# Patient Record
Sex: Male | Born: 1935 | Race: White | Hispanic: No | Marital: Single | Smoking: Never smoker
Health system: Southern US, Community
[De-identification: ages and names within clinical notes are randomized; demographics above are authoritative.]

## PROBLEM LIST (undated history)

## (undated) DIAGNOSIS — C61 Malignant neoplasm of prostate: Secondary | ICD-10-CM

## (undated) DIAGNOSIS — Q899 Congenital malformation, unspecified: Secondary | ICD-10-CM

## (undated) DIAGNOSIS — N200 Calculus of kidney: Secondary | ICD-10-CM

## (undated) HISTORY — PX: CATARACT EXTRACTION W/ INTRAOCULAR LENS IMPLANT: SHX1309

---

## 2011-02-06 ENCOUNTER — Emergency Department (HOSPITAL_COMMUNITY): Payer: Medicare Other

## 2011-02-06 ENCOUNTER — Emergency Department (HOSPITAL_COMMUNITY)
Admission: EM | Admit: 2011-02-06 | Discharge: 2011-02-06 | Discharge status: Against Medical Advice | Payer: Medicare Other | Attending: Emergency Medicine | Admitting: Emergency Medicine

## 2011-02-06 DIAGNOSIS — Y9269 Other specified industrial and construction area as the place of occurrence of the external cause: Secondary | ICD-10-CM | POA: Insufficient documentation

## 2011-02-06 DIAGNOSIS — S0003XA Contusion of scalp, initial encounter: Secondary | ICD-10-CM | POA: Insufficient documentation

## 2011-02-06 DIAGNOSIS — W1809XA Striking against other object with subsequent fall, initial encounter: Secondary | ICD-10-CM | POA: Insufficient documentation

## 2011-02-06 DIAGNOSIS — S0120XA Unspecified open wound of nose, initial encounter: Secondary | ICD-10-CM | POA: Insufficient documentation

## 2012-11-18 ENCOUNTER — Encounter (HOSPITAL_COMMUNITY): Payer: Self-pay | Admitting: *Deleted

## 2012-11-18 ENCOUNTER — Emergency Department (HOSPITAL_COMMUNITY)
Admission: EM | Admit: 2012-11-18 | Discharge: 2012-11-18 | Disposition: A | Discharge status: Home / Self Care | Payer: Medicare Other | Attending: Emergency Medicine | Admitting: Emergency Medicine

## 2012-11-18 ENCOUNTER — Emergency Department (HOSPITAL_COMMUNITY): Payer: Medicare Other

## 2012-11-18 ENCOUNTER — Other Ambulatory Visit: Payer: Self-pay

## 2012-11-18 DIAGNOSIS — E875 Hyperkalemia: Secondary | ICD-10-CM

## 2012-11-18 DIAGNOSIS — R197 Diarrhea, unspecified: Secondary | ICD-10-CM | POA: Insufficient documentation

## 2012-11-18 DIAGNOSIS — R112 Nausea with vomiting, unspecified: Secondary | ICD-10-CM | POA: Insufficient documentation

## 2012-11-18 DIAGNOSIS — N201 Calculus of ureter: Secondary | ICD-10-CM | POA: Insufficient documentation

## 2012-11-18 LAB — CBC WITH DIFFERENTIAL/PLATELET
Basophils Absolute: 0 10*3/uL (ref 0.0–0.1)
Basophils Relative: 0 % (ref 0–1)
Eosinophils Absolute: 0 10*3/uL (ref 0.0–0.7)
Eosinophils Relative: 0 % (ref 0–5)
HCT: 48.5 % (ref 39.0–52.0)
Hemoglobin: 16.7 g/dL (ref 13.0–17.0)
MCH: 30.8 pg (ref 26.0–34.0)
MCHC: 34.4 g/dL (ref 30.0–36.0)
Monocytes Absolute: 0.2 10*3/uL (ref 0.1–1.0)
Monocytes Relative: 2 % — ABNORMAL LOW (ref 3–12)
Neutro Abs: 9.2 10*3/uL — ABNORMAL HIGH (ref 1.7–7.7)
RDW: 13.2 % (ref 11.5–15.5)

## 2012-11-18 LAB — URINALYSIS, ROUTINE W REFLEX MICROSCOPIC
Bilirubin Urine: NEGATIVE
Glucose, UA: NEGATIVE mg/dL
Leukocytes, UA: NEGATIVE
pH: 6.5 (ref 5.0–8.0)

## 2012-11-18 LAB — URINE MICROSCOPIC-ADD ON

## 2012-11-18 LAB — COMPREHENSIVE METABOLIC PANEL
AST: 21 U/L (ref 0–37)
Albumin: 3.9 g/dL (ref 3.5–5.2)
BUN: 19 mg/dL (ref 6–23)
Calcium: 9.7 mg/dL (ref 8.4–10.5)
Creatinine, Ser: 1.22 mg/dL (ref 0.50–1.35)
Total Bilirubin: 1.1 mg/dL (ref 0.3–1.2)
Total Protein: 7.6 g/dL (ref 6.0–8.3)

## 2012-11-18 MED ORDER — SODIUM CHLORIDE 0.9 % IV SOLN
INTRAVENOUS | Status: DC
  Administered 2012-11-18: 14:00:00 via INTRAVENOUS

## 2012-11-18 MED ORDER — TAMSULOSIN HCL 0.4 MG PO CAPS: ORAL_CAPSULE | ORAL | Status: DC

## 2012-11-18 MED ORDER — HYDROMORPHONE HCL PF 1 MG/ML IJ SOLN
INTRAMUSCULAR | Status: AC
  Filled 2012-11-18: qty 1

## 2012-11-18 MED ORDER — SODIUM POLYSTYRENE SULFONATE 15 GM/60ML PO SUSP
30.0000 g | Freq: Once | ORAL | Status: AC
  Administered 2012-11-18: 30 g via ORAL
  Filled 2012-11-18: qty 120

## 2012-11-18 MED ORDER — MORPHINE SULFATE 4 MG/ML IJ SOLN
4.0000 mg | Freq: Once | INTRAMUSCULAR | Status: AC
  Administered 2012-11-18: 4 mg via INTRAVENOUS
  Filled 2012-11-18: qty 1

## 2012-11-18 MED ORDER — HYDROMORPHONE HCL PF 1 MG/ML IJ SOLN
1.0000 mg | Freq: Once | INTRAMUSCULAR | Status: AC
  Administered 2012-11-18: 1 mg via INTRAVENOUS
  Filled 2012-11-18: qty 1

## 2012-11-18 MED ORDER — HYDROMORPHONE HCL PF 1 MG/ML IJ SOLN
1.0000 mg | Freq: Once | INTRAMUSCULAR | Status: AC
  Administered 2012-11-18: 1 mg via INTRAVENOUS

## 2012-11-18 MED ORDER — ONDANSETRON 8 MG PO TBDP: 8.0000 mg | ORAL_TABLET | Freq: Three times a day (TID) | ORAL | Status: DC | PRN

## 2012-11-18 MED ORDER — SODIUM CHLORIDE 0.9 % IV BOLUS (SEPSIS)
700.0000 mL | Freq: Once | INTRAVENOUS | Status: AC
  Administered 2012-11-18: 20:00:00 via INTRAVENOUS

## 2012-11-18 MED ORDER — ONDANSETRON HCL 4 MG/2ML IJ SOLN
4.0000 mg | Freq: Once | INTRAMUSCULAR | Status: AC
  Administered 2012-11-18: 4 mg via INTRAVENOUS
  Filled 2012-11-18: qty 2

## 2012-11-18 MED ORDER — OXYCODONE-ACETAMINOPHEN 5-325 MG PO TABS: ORAL_TABLET | ORAL | Status: DC

## 2012-11-18 NOTE — ED Provider Notes (Addendum)
History   This chart was scribed for Ward Givens, MD by Charolett Bumpers, ED Scribe. The patient was seen in room APA06/APA06. Patient's care was started at 1327.   CSN: [id number]  Arrival date & time 11/18/12  1246   First MD Initiated Contact with Patient 11/18/12 1327      Chief Complaint  Patient presents with  . Abdominal Pain   Tory Chesnut is a 77 y.o. male who presents to the Emergency Department complaining of constant, sudden onset lower left flank pain that started around 8 am this morning. He has had x3 episodes of nausea, vomiting and non-bloody diarrhea. He reports minimal left-sided abdominal pain. He denies any modifying factors. He denies any melena, hematemesis, fever, dizziness, light-headedness, hematuria or dark urine. He denies any h/o similar symptoms. He denies any recent antibiotic use. He ambulates independently. He denies any h/o an aneurysm, kidney stones.    Patient is a 77 y.o. male presenting with flank pain. The history is provided by the patient. No language interpreter was used.  Flank Pain This is a new problem. The current episode started 3 to 5 hours ago. The problem occurs constantly. The problem has been gradually worsening. Associated symptoms include abdominal pain. Nothing aggravates the symptoms. Nothing relieves the symptoms.   PCP: Dr. Ouida Sills  History reviewed. No pertinent past medical history.  History reviewed. No pertinent past surgical history.  History reviewed. No pertinent family history.  History  Substance Use Topics  . Smoking status: Never Smoker   . Smokeless tobacco: Not on file  . Alcohol Use: No   He lives at home with his sister.    Review of Systems  Constitutional: Negative for fever.  Gastrointestinal: Positive for nausea, vomiting, abdominal pain and diarrhea.  Genitourinary: Positive for flank pain. Negative for hematuria.  Neurological: Negative for dizziness and weakness.  All other systems reviewed and  are negative.    Allergies  Review of patient's allergies indicates no known allergies.  Home Medications  No current outpatient prescriptions on file.  BP 135/75  Pulse 68  Temp 97.4 F (36.3 C) (Oral)  Resp 20  Ht 5\' 9"  (1.753 m)  Wt 195 lb (88.451 kg)  BMI 28.80 kg/m2  SpO2 100%  Vital signs normal    Physical Exam  Nursing note and vitals reviewed. Constitutional: He is oriented to person, place, and time. He appears well-developed and well-nourished. No distress.  HENT:  Head: Normocephalic and atraumatic.  Right Ear: External ear normal.  Left Ear: External ear normal.  Nose: Nose normal.  Mouth/Throat: Oropharynx is clear and moist.  Eyes: Conjunctivae normal and EOM are normal. Pupils are equal, round, and reactive to light.  Neck: Normal range of motion. Neck supple. No tracheal deviation present.  Cardiovascular: Normal rate, regular rhythm and normal heart sounds.   No murmur heard. Pulmonary/Chest: Effort normal and breath sounds normal. No respiratory distress. He has no wheezes. He has no rales.  Abdominal: Soft. Bowel sounds are normal. He exhibits no distension and no mass. There is no tenderness. There is no rebound and no guarding.       No pulsatile masses. Unable to localize pain by palpation  Musculoskeletal: Normal range of motion. He exhibits tenderness. He exhibits no edema.       Lower left flank is non-tender but is where he states he has pain  Neurological: He is alert and oriented to person, place, and time.  Skin: Skin is warm and dry.  Psychiatric: His behavior is normal.       Flat affect    ED Course  Procedures (including critical care time)   Medications  0.9 %  sodium chloride infusion (  Intravenous New Bag/Given 11/18/12 1420)  latanoprost (XALATAN) 0.005 % ophthalmic solution (not administered)  oxyCODONE-acetaminophen (PERCOCET/ROXICET) 5-325 MG per tablet (not administered)  ondansetron (ZOFRAN ODT) 8 MG disintegrating  tablet (not administered)  Tamsulosin HCl (FLOMAX) 0.4 MG CAPS (not administered)  morphine 4 MG/ML injection 4 mg (4 mg Intravenous Given 11/18/12 1418)  ondansetron (ZOFRAN) injection 4 mg (4 mg Intravenous Given 11/18/12 1418)  HYDROmorphone (DILAUDID) injection 1 mg (1 mg Intravenous Given 11/18/12 1437)  morphine 4 MG/ML injection 4 mg (4 mg Intravenous Given 11/18/12 1644)  sodium polystyrene (KAYEXALATE) 15 GM/60ML suspension 30 g (30 g Oral Given 11/18/12 2014)  sodium chloride 0.9 % bolus 700 mL (0 mL Intravenous Stopped 11/18/12 2136)     DIAGNOSTIC STUDIES: Oxygen Saturation is 100% on room air, normal by my interpretation.    COORDINATION OF CARE:  13:45-Discussed planned course of treatment with the patient including, pain medication, CT of abdomen, UA and blood work, who is agreeable at this time.   Lab reports blood not hemolyzed.  Pt given kayexalate for his hyperkalemia.  Bladder scan done after patient stated he couldn't urinate. It showed 192 cc of urine. Pt given IV bolus.   Pt finally gave urine sample at 21:39 which was very cloudy and dark brown in color.   Results for orders placed during the hospital encounter of 11/18/12  URINALYSIS, ROUTINE W REFLEX MICROSCOPIC      Component Value Range   Color, Urine YELLOW  YELLOW   APPearance HAZY (*) CLEAR   Specific Gravity, Urine 1.020  1.005 - 1.030   pH 6.5  5.0 - 8.0   Glucose, UA NEGATIVE  NEGATIVE mg/dL   Hgb urine dipstick LARGE (*) NEGATIVE   Bilirubin Urine NEGATIVE  NEGATIVE   Ketones, ur TRACE (*) NEGATIVE mg/dL   Protein, ur TRACE (*) NEGATIVE mg/dL   Urobilinogen, UA 0.2  0.0 - 1.0 mg/dL   Nitrite NEGATIVE  NEGATIVE   Leukocytes, UA NEGATIVE  NEGATIVE  CBC WITH DIFFERENTIAL      Component Value Range   WBC 10.1  4.0 - 10.5 K/uL   RBC 5.43  4.22 - 5.81 MIL/uL   Hemoglobin 16.7  13.0 - 17.0 g/dL   HCT 16.1  09.6 - 04.5 %   MCV 89.3  78.0 - 100.0 fL   MCH 30.8  26.0 - 34.0 pg   MCHC 34.4  30.0 -  36.0 g/dL   RDW 40.9  81.1 - 91.4 %   Platelets 194  150 - 400 K/uL   Neutrophils Relative 91 (*) 43 - 77 %   Neutro Abs 9.2 (*) 1.7 - 7.7 K/uL   Lymphocytes Relative 7 (*) 12 - 46 %   Lymphs Abs 0.7  0.7 - 4.0 K/uL   Monocytes Relative 2 (*) 3 - 12 %   Monocytes Absolute 0.2  0.1 - 1.0 K/uL   Eosinophils Relative 0  0 - 5 %   Eosinophils Absolute 0.0  0.0 - 0.7 K/uL   Basophils Relative 0  0 - 1 %   Basophils Absolute 0.0  0.0 - 0.1 K/uL  COMPREHENSIVE METABOLIC PANEL      Component Value Range   Sodium 137  135 - 145 mEq/L   Potassium 5.9 (*) 3.5 - 5.1  mEq/L   Chloride 101  96 - 112 mEq/L   CO2 25  19 - 32 mEq/L   Glucose, Bld 137 (*) 70 - 99 mg/dL   BUN 19  6 - 23 mg/dL   Creatinine, Ser 8.11  0.50 - 1.35 mg/dL   Calcium 9.7  8.4 - 91.4 mg/dL   Total Protein 7.6  6.0 - 8.3 g/dL   Albumin 3.9  3.5 - 5.2 g/dL   AST 21  0 - 37 U/L   ALT 23  0 - 53 U/L   Alkaline Phosphatase 75  39 - 117 U/L   Total Bilirubin 1.1  0.3 - 1.2 mg/dL   GFR calc non Af Amer 56 (*) >90 mL/min   GFR calc Af Amer 65 (*) >90 mL/min  URINE MICROSCOPIC-ADD ON      Component Value Range   Squamous Epithelial / LPF RARE  RARE   WBC, UA 0-2  <3 WBC/hpf   RBC / HPF TOO NUMEROUS TO COUNT  <3 RBC/hpf   Bacteria, UA FEW (*) RARE    UA pending, Dr Preston Fleeting to check  Laboratory interpretation all normal except hyperkalemia, hematuria without WBC's    Ct Abdomen Pelvis Wo Contrast  11/18/2012  *RADIOLOGY REPORT*  Clinical Data: Abdominal pain and vomiting.  Diarrhea.  Left flank pain.  CT ABDOMEN AND PELVIS WITHOUT CONTRAST  Technique:  Multidetector CT imaging of the abdomen and pelvis was performed following the standard protocol without intravenous contrast.  Comparison: None.  Findings: Mild interstitial coarsening is present bilaterally. There is some bronchiectasis at the right lung base.  No focal airspace consolidation is present.  No significant nodule or mass lesion is present.  The heart size is  normal.  No significant pleural or pericardial effusion is present.  The liver and spleen are within normal limits.  There is wall thickening and dilation of the distal esophagus.  The duodenal diverticulum is present in the second portion of the duodenum.  The pancreas is unremarkable.  The common bile duct and gallbladder are normal.  The adrenal glands are normal bilaterally.  The right kidney and ureter are unremarkable.  Mild left-sided hydronephrosis and perirenal stranding is evident. A 2 mm distal left ureteral jet stone is present just above the UVJ.  The urinary bladder is within normal limits.  A loop of the superior sigmoid colon and associated fat is herniated into the left inguinal canal.  This is not obstructing.  The more distal rectosigmoid colon is within normal limits.  The remainder of the colon is mostly collapsed.  The appendix is visualized and normal. The small bowel is unremarkable.  An umbilical hernia measures 2.2 cm in width.  There is no associated bowel.  The bone windows demonstrate mild leftward curvature of the lumbar spine, centered at L3.  Endplate degenerative changes are present in the lower lumbar spine.  No focal lytic or blastic lesions are evident.  IMPRESSION:  1.  Obstructing 2 mm distal left ureteral stone with mild left- sided hydronephrosis and ureteral dilation. 2.  A loop of sigmoid colon and fat herniating into the left inguinal canal without obstruction. 3.  The atherosclerosis. 4.  Dilation of the distal esophagus and wall thickening.  This may related to a motility disorder.  Neoplasm is considered less likely.  Consider endoscopy if not recently performed. 5.  Chronic interstitial lung changes as described.   Original Report Authenticated By: Marin Roberts, M.D.     Date: 11/18/2012  Rate: 82  Rhythm: normal sinus rhythm  QRS Axis: left  Intervals: normal  ST/T Wave abnormalities: normal  Conduction Disutrbances:none  Narrative Interpretation:    Old EKG Reviewed: none available     1. Ureteral stone   2. Hyperkalemia     New Prescriptions   ONDANSETRON (ZOFRAN ODT) 8 MG DISINTEGRATING TABLET    Take 1 tablet (8 mg total) by mouth every 8 (eight) hours as needed for nausea.   OXYCODONE-ACETAMINOPHEN (PERCOCET/ROXICET) 5-325 MG PER TABLET    Take 1 or 2 po Q 6hrs for pain   TAMSULOSIN HCL (FLOMAX) 0.4 MG CAPS    Once daily until you pass the kidney stone    Plan discharge  Devoria Albe, MD, FACEP   MDM  I personally performed the services described in this documentation, which was scribed in my presence. The recorded information has been reviewed and considered.  Devoria Albe, MD, FACEP      Ward Givens, MD 11/18/12 1610  Ward Givens, MD 11/18/12 2222

## 2012-11-18 NOTE — ED Notes (Signed)
Family at bedside. 

## 2012-11-18 NOTE — ED Notes (Signed)
Pt has finished kayexalate.

## 2012-11-18 NOTE — ED Notes (Signed)
Patient states he is unable to give a urine sample at this time

## 2012-11-18 NOTE — ED Notes (Signed)
Assisted patient with a urinal. Patient was not able to void. Patient states he does not want a in and out done. RN made aware.

## 2012-11-18 NOTE — ED Notes (Addendum)
Used bladder scanner on pt. Greater than 192 ml of urine in bladder. EDP will be notified and bolus started. Pt has lump on the left side of his groin area. EDP made aware

## 2012-11-18 NOTE — ED Notes (Signed)
abd and back pain for 3 hours,  Vomiting, diarrhea.

## 2014-07-14 DIAGNOSIS — C61 Malignant neoplasm of prostate: Secondary | ICD-10-CM

## 2014-07-14 HISTORY — DX: Malignant neoplasm of prostate: C61

## 2014-07-23 ENCOUNTER — Emergency Department (HOSPITAL_COMMUNITY): Payer: Medicare Other

## 2014-07-23 ENCOUNTER — Emergency Department (HOSPITAL_COMMUNITY)
Admission: EM | Admit: 2014-07-23 | Discharge: 2014-07-23 | Disposition: A | Discharge status: Home / Self Care | Payer: Medicare Other | Attending: Emergency Medicine | Admitting: Emergency Medicine

## 2014-07-23 ENCOUNTER — Encounter (HOSPITAL_COMMUNITY): Payer: Self-pay | Admitting: Emergency Medicine

## 2014-07-23 DIAGNOSIS — K4030 Unilateral inguinal hernia, with obstruction, without gangrene, not specified as recurrent: Secondary | ICD-10-CM | POA: Diagnosis not present

## 2014-07-23 DIAGNOSIS — Z87442 Personal history of urinary calculi: Secondary | ICD-10-CM | POA: Insufficient documentation

## 2014-07-23 DIAGNOSIS — R109 Unspecified abdominal pain: Secondary | ICD-10-CM | POA: Diagnosis present

## 2014-07-23 DIAGNOSIS — Z79899 Other long term (current) drug therapy: Secondary | ICD-10-CM | POA: Diagnosis not present

## 2014-07-23 DIAGNOSIS — K5900 Constipation, unspecified: Secondary | ICD-10-CM | POA: Diagnosis not present

## 2014-07-23 DIAGNOSIS — R591 Generalized enlarged lymph nodes: Secondary | ICD-10-CM

## 2014-07-23 DIAGNOSIS — R599 Enlarged lymph nodes, unspecified: Secondary | ICD-10-CM | POA: Insufficient documentation

## 2014-07-23 DIAGNOSIS — R110 Nausea: Secondary | ICD-10-CM | POA: Insufficient documentation

## 2014-07-23 HISTORY — DX: Calculus of kidney: N20.0

## 2014-07-23 LAB — CBC WITH DIFFERENTIAL/PLATELET
Basophils Absolute: 0 10*3/uL (ref 0.0–0.1)
Basophils Relative: 0 % (ref 0–1)
EOS ABS: 0 10*3/uL (ref 0.0–0.7)
Eosinophils Relative: 0 % (ref 0–5)
HCT: 46.9 % (ref 39.0–52.0)
HEMOGLOBIN: 16.2 g/dL (ref 13.0–17.0)
LYMPHS ABS: 0.6 10*3/uL — AB (ref 0.7–4.0)
LYMPHS PCT: 2 % — AB (ref 12–46)
MCH: 30.5 pg (ref 26.0–34.0)
MCHC: 34.5 g/dL (ref 30.0–36.0)
MCV: 88.2 fL (ref 78.0–100.0)
MONOS PCT: 5 % (ref 3–12)
Monocytes Absolute: 1 10*3/uL (ref 0.1–1.0)
NEUTROS PCT: 93 % — AB (ref 43–77)
Neutro Abs: 20.9 10*3/uL — ABNORMAL HIGH (ref 1.7–7.7)
Platelets: 231 10*3/uL (ref 150–400)
RBC: 5.32 MIL/uL (ref 4.22–5.81)
RDW: 13.4 % (ref 11.5–15.5)
WBC: 22.5 10*3/uL — AB (ref 4.0–10.5)

## 2014-07-23 LAB — URINALYSIS, ROUTINE W REFLEX MICROSCOPIC
Glucose, UA: NEGATIVE mg/dL
Ketones, ur: NEGATIVE mg/dL
Leukocytes, UA: NEGATIVE
NITRITE: NEGATIVE
PH: 5 (ref 5.0–8.0)
Protein, ur: NEGATIVE mg/dL
Urobilinogen, UA: 2 mg/dL — ABNORMAL HIGH (ref 0.0–1.0)

## 2014-07-23 LAB — COMPREHENSIVE METABOLIC PANEL
ALK PHOS: 95 U/L (ref 39–117)
ALT: 23 U/L (ref 0–53)
ANION GAP: 14 (ref 5–15)
AST: 29 U/L (ref 0–37)
Albumin: 3.6 g/dL (ref 3.5–5.2)
BILIRUBIN TOTAL: 1.6 mg/dL — AB (ref 0.3–1.2)
BUN: 26 mg/dL — AB (ref 6–23)
CO2: 24 meq/L (ref 19–32)
Calcium: 9.3 mg/dL (ref 8.4–10.5)
Chloride: 102 mEq/L (ref 96–112)
Creatinine, Ser: 1.09 mg/dL (ref 0.50–1.35)
GFR, EST AFRICAN AMERICAN: 74 mL/min — AB (ref >90)
GFR, EST NON AFRICAN AMERICAN: 63 mL/min — AB (ref >90)
GLUCOSE: 191 mg/dL — AB (ref 70–99)
POTASSIUM: 5.1 meq/L (ref 3.7–5.3)
Sodium: 140 mEq/L (ref 137–147)
TOTAL PROTEIN: 8 g/dL (ref 6.0–8.3)

## 2014-07-23 LAB — URINE MICROSCOPIC-ADD ON

## 2014-07-23 MED ORDER — ONDANSETRON HCL 4 MG/2ML IJ SOLN
4.0000 mg | Freq: Once | INTRAMUSCULAR | Status: AC
  Administered 2014-07-23: 4 mg via INTRAVENOUS
  Filled 2014-07-23: qty 2

## 2014-07-23 MED ORDER — SODIUM CHLORIDE 0.9 % IJ SOLN
INTRAMUSCULAR | Status: AC
  Filled 2014-07-23: qty 30

## 2014-07-23 MED ORDER — IOHEXOL 300 MG/ML  SOLN
100.0000 mL | Freq: Once | INTRAMUSCULAR | Status: AC | PRN
  Administered 2014-07-23: 100 mL via INTRAVENOUS

## 2014-07-23 MED ORDER — HYDROCODONE-ACETAMINOPHEN 5-325 MG PO TABS: ORAL_TABLET | ORAL | Status: DC

## 2014-07-23 MED ORDER — MORPHINE SULFATE 4 MG/ML IJ SOLN
4.0000 mg | Freq: Once | INTRAMUSCULAR | Status: AC
  Administered 2014-07-23: 4 mg via INTRAVENOUS
  Filled 2014-07-23: qty 1

## 2014-07-23 MED ORDER — MORPHINE SULFATE 2 MG/ML IJ SOLN
2.0000 mg | Freq: Once | INTRAMUSCULAR | Status: AC
  Administered 2014-07-23: 2 mg via INTRAVENOUS
  Filled 2014-07-23: qty 1

## 2014-07-23 NOTE — Consult Note (Signed)
Reason for Consult: Incarcerated left anal hernia Referring Physician: ER  Bill Padilla is an 79 y.o. male.  HPI: Patient is a 79 year old white male who presented emergency room with left-sided abdominal pain. He did have an episode of emesis yesterday evening. He thought he had a kidney stone which she states she is having the past. On examination, he was noted to have a large left inguinal hernia which could not be reduced. He states he has had hernias present for years. Currently he does not have pain in the left groin region  Past Medical History  Diagnosis Date  . Kidney stones     History reviewed. No pertinent past surgical history.  No family history on file.  Social History:  reports that he has never smoked. He does not have any smokeless tobacco history on file. He reports that he does not drink alcohol or use illicit drugs.  Allergies: No Known Allergies  Medications: I have reviewed the patient's current medications.  Results for orders placed during the hospital encounter of 07/23/14 (from the past 48 hour(s))  CBC WITH DIFFERENTIAL     Status: Abnormal   Collection Time    07/23/14  9:02 AM      Result Value Ref Range   WBC 22.5 (*) 4.0 - 10.5 K/uL   RBC 5.32  4.22 - 5.81 MIL/uL   Hemoglobin 16.2  13.0 - 17.0 g/dL   HCT 46.9  39.0 - 52.0 %   MCV 88.2  78.0 - 100.0 fL   MCH 30.5  26.0 - 34.0 pg   MCHC 34.5  30.0 - 36.0 g/dL   RDW 13.4  11.5 - 15.5 %   Platelets 231  150 - 400 K/uL   Neutrophils Relative % 93 (*) 43 - 77 %   Neutro Abs 20.9 (*) 1.7 - 7.7 K/uL   Lymphocytes Relative 2 (*) 12 - 46 %   Lymphs Abs 0.6 (*) 0.7 - 4.0 K/uL   Monocytes Relative 5  3 - 12 %   Monocytes Absolute 1.0  0.1 - 1.0 K/uL   Eosinophils Relative 0  0 - 5 %   Eosinophils Absolute 0.0  0.0 - 0.7 K/uL   Basophils Relative 0  0 - 1 %   Basophils Absolute 0.0  0.0 - 0.1 K/uL  COMPREHENSIVE METABOLIC PANEL     Status: Abnormal   Collection Time    07/23/14  9:02 AM      Result  Value Ref Range   Sodium 140  137 - 147 mEq/L   Potassium 5.1  3.7 - 5.3 mEq/L   Chloride 102  96 - 112 mEq/L   CO2 24  19 - 32 mEq/L   Glucose, Bld 191 (*) 70 - 99 mg/dL   BUN 26 (*) 6 - 23 mg/dL   Creatinine, Ser 1.09  0.50 - 1.35 mg/dL   Calcium 9.3  8.4 - 10.5 mg/dL   Total Protein 8.0  6.0 - 8.3 g/dL   Albumin 3.6  3.5 - 5.2 g/dL   AST 29  0 - 37 U/L   ALT 23  0 - 53 U/L   Alkaline Phosphatase 95  39 - 117 U/L   Total Bilirubin 1.6 (*) 0.3 - 1.2 mg/dL   GFR calc non Af Amer 63 (*) >90 mL/min   GFR calc Af Amer 74 (*) >90 mL/min   Comment: (NOTE)     The eGFR has been calculated using the CKD EPI equation.  This calculation has not been validated in all clinical situations.     eGFR's persistently <90 mL/min signify possible Chronic Kidney     Disease.   Anion gap 14  5 - 15    Ct Abdomen Pelvis W Contrast  07/23/2014   CLINICAL DATA:  Abdominal pain, bloating, history kidney stones  EXAM: CT ABDOMEN AND PELVIS WITH CONTRAST  TECHNIQUE: Multidetector CT imaging of the abdomen and pelvis was performed using the standard protocol following bolus administration of intravenous contrast.  CONTRAST:  123mL OMNIPAQUE IOHEXOL 300 MG/ML SOLN Sagittal and coronal MPR images reconstructed from axial data set. IV. Dilute oral contrast.  COMPARISON:  11/18/2012  FINDINGS: Dependent atelectasis RIGHT lower lobe.  BILATERAL gynecomastia.  Liver, spleen, pancreas, kidneys, and adrenal glands normal appearance.  Retained contrast within esophagus question gastroesophageal reflux.  Adenopathy identified at inferior mediastinum, retrocrural, retroperitoneum and pelvis.  Paraesophageal node inferior mediastinum 15 mm short axis image 10.  Aortocaval node 16 mm short axis image 51.  RIGHT common iliac node 24 mm short axis image 63.  RIGHT external iliac node 17 mm short axis image 78.  BILATERAL inguinal hernias, smaller on RIGHT containing fat, larger on LEFT containing fat and nonobstructed segment of  sigmoid colon.  Segment of mild wall thickening of sigmoid colon within and immediately proximal to LEFT inguinal hernia associated with mild adjacent edema of the mesocolon question incarceration.  Mild hyper enhancement of the RIGHT lateral aspect of the rectum is noted, question proctitis No potentially large hemorrhoids could cause a similar appearance.  Stomach and remaining bowel loops normal appearance.  Normal appendix.  Umbilical hernia containing fat.  No additional mass, free fluid, or free air.  Prostatic enlargement.  Multilevel degenerative disc and facet disease changes primarily at lower lumbar spine.  IMPRESSION: Umbilical and RIGHT inguinal hernias containing fat.  LEFT inguinal hernia containing a segment of proximal sigmoid colon, with wall thickening of the involved and more proximal sigmoid colon which likely represents edema and incarceration of the sigmoid colon within the hernia sac.  Enhancement of rectal wall question proctitis.  Retroperitoneal, pelvic, retrocrural in inferior mediastinal adenopathy of uncertain etiology.  Differential diagnosis would include lymphoma, metastatic disease, unlikely reactive process.  Findings discussed with Evalee Jefferson PA in ED on 07/23/2014 at 1205 hours.   Electronically Signed   By: Lavonia Dana M.D.   On: 07/23/2014 12:06    ROS: See chart Blood pressure 114/71, pulse 82, temperature 97.9 F (36.6 C), temperature source Oral, resp. rate 18, height $RemoveBe'5\' 9"'sGTSrlSth$  (1.753 m), weight 88.451 kg (195 lb), SpO2 95.00%. Physical Exam: Pleasant white male no acute distress. Abdomen was soft without tenderness or rigidity. Bilateral inguinal hernias are present, left greater than right. The left anal hernia was easily reduced without difficulty. He does reemerge but again is easily reduced.  Assessment/Plan: Impression: Incarcerated left inguinal hernia, resolved. No need for acute surgical intervention. Plan: Given the CT findings, further workup of possible  neoplastic process needs to be performed prior to any elective inguinal hernia repair. He was told he needs to follow up Dr. Willey Blade as soon as he can. I will see him in followup as needed once this workup has been performed.  Kenly Xiao A 07/23/2014, 12:40 PM

## 2014-07-23 NOTE — ED Provider Notes (Signed)
CSN: [id number]     Arrival date & time 07/23/14  7673 History   First MD Initiated Contact with Patient 07/23/14 [phone]     Chief Complaint  Patient presents with  . Abdominal Pain     (Consider location/radiation/quality/duration/timing/severity/associated sxs/prior Treatment) The history is provided by the patient.   Bill Padilla is a 79 y.o. male presenting with lower abdominal pain, low back pain, nausea with several episodes of non bloody emesis last night.  His pain started in his bilateral lower back and moved to his lower abdomen overnight.  He states this reminds him of his previous episode of kidney stone in 2013 although denies dysuria or hematuria.  He has had no fevers.  He has not had a bowel movement in several days,  So took a stool softener yesterday. He had several episodes of soft stool overnight which did not improve his pain. He denies abdominal distention.  Past medical history is noncontributory.  He does report a chronic left inguinal hernia which does not cause pain.    Past Medical History  Diagnosis Date  . Kidney stones    History reviewed. No pertinent past surgical history. No family history on file. History  Substance Use Topics  . Smoking status: Never Smoker   . Smokeless tobacco: Not on file  . Alcohol Use: No    Review of Systems  Constitutional: Negative for fever and chills.  HENT: Negative for congestion and sore throat.   Eyes: Negative.   Respiratory: Negative for chest tightness and shortness of breath.   Cardiovascular: Negative for chest pain.  Gastrointestinal: Positive for nausea, vomiting, abdominal pain and constipation. Negative for abdominal distention.  Genitourinary: Negative.  Negative for dysuria, frequency, hematuria and difficulty urinating.  Musculoskeletal: Negative for arthralgias, joint swelling and neck pain.  Skin: Negative.  Negative for rash and wound.  Neurological: Negative for dizziness, weakness, light-headedness,  numbness and headaches.  Psychiatric/Behavioral: Negative.       Allergies  Review of patient's allergies indicates no known allergies.  Home Medications   Prior to Admission medications   Medication Sig Start Date End Date Taking? Authorizing Provider  latanoprost (XALATAN) 0.005 % ophthalmic solution Place 1 drop into both eyes at bedtime.   Yes Historical Provider, MD  HYDROcodone-acetaminophen (NORCO/VICODIN) 5-325 MG per tablet 1/2 to 1 tablets every 4 hours if needed for abdominal pain 07/23/14   Evalee Jefferson, PA-C   BP 119/86  Pulse 82  Temp(Src) 97.9 F (36.6 C) (Oral)  Resp 18  Ht 5\' 9"  (1.753 m)  Wt 195 lb (88.451 kg)  BMI 28.78 kg/m2  SpO2 95% Physical Exam  Nursing note and vitals reviewed. Constitutional: He appears well-developed and well-nourished.  HENT:  Head: Normocephalic and atraumatic.  Eyes: Conjunctivae are normal.  Neck: Normal range of motion.  Cardiovascular: Normal rate, regular rhythm, normal heart sounds and intact distal pulses.   Pulmonary/Chest: Effort normal and breath sounds normal. No respiratory distress. He has no wheezes.  Abdominal: Soft. Bowel sounds are decreased. There is no hepatosplenomegaly. There is tenderness in the suprapubic area. There is no rigidity, no rebound, no guarding, no CVA tenderness, no tenderness at McBurney's point and negative Murphy's sign. A hernia is present. Hernia confirmed positive in the left inguinal area.  Inguinal hernia which is not easily reducible.  Pt denies pain at this location.  Musculoskeletal: Normal range of motion.  Neurological: He is alert.  Skin: Skin is warm and dry.  Psychiatric: He has a normal  mood and affect.    ED Course  Procedures (including critical care time) Labs Review Labs Reviewed  URINALYSIS, ROUTINE W REFLEX MICROSCOPIC - Abnormal; Notable for the following:    Color, Urine AMBER (*)    Specific Gravity, Urine <1.005 (*)    Hgb urine dipstick MODERATE (*)    Bilirubin  Urine SMALL (*)    Urobilinogen, UA 2.0 (*)    All other components within normal limits  CBC WITH DIFFERENTIAL - Abnormal; Notable for the following:    WBC 22.5 (*)    Neutrophils Relative % 93 (*)    Neutro Abs 20.9 (*)    Lymphocytes Relative 2 (*)    Lymphs Abs 0.6 (*)    All other components within normal limits  COMPREHENSIVE METABOLIC PANEL - Abnormal; Notable for the following:    Glucose, Bld 191 (*)    BUN 26 (*)    Total Bilirubin 1.6 (*)    GFR calc non Af Amer 63 (*)    GFR calc Af Amer 74 (*)    All other components within normal limits  URINE MICROSCOPIC-ADD ON    Imaging Review Ct Abdomen Pelvis W Contrast  07/23/2014   CLINICAL DATA:  Abdominal pain, bloating, history kidney stones  EXAM: CT ABDOMEN AND PELVIS WITH CONTRAST  TECHNIQUE: Multidetector CT imaging of the abdomen and pelvis was performed using the standard protocol following bolus administration of intravenous contrast.  CONTRAST:  128mL OMNIPAQUE IOHEXOL 300 MG/ML SOLN Sagittal and coronal MPR images reconstructed from axial data set. IV. Dilute oral contrast.  COMPARISON:  11/18/2012  FINDINGS: Dependent atelectasis RIGHT lower lobe.  BILATERAL gynecomastia.  Liver, spleen, pancreas, kidneys, and adrenal glands normal appearance.  Retained contrast within esophagus question gastroesophageal reflux.  Adenopathy identified at inferior mediastinum, retrocrural, retroperitoneum and pelvis.  Paraesophageal node inferior mediastinum 15 mm short axis image 10.  Aortocaval node 16 mm short axis image 51.  RIGHT common iliac node 24 mm short axis image 63.  RIGHT external iliac node 17 mm short axis image 78.  BILATERAL inguinal hernias, smaller on RIGHT containing fat, larger on LEFT containing fat and nonobstructed segment of sigmoid colon.  Segment of mild wall thickening of sigmoid colon within and immediately proximal to LEFT inguinal hernia associated with mild adjacent edema of the mesocolon question incarceration.   Mild hyper enhancement of the RIGHT lateral aspect of the rectum is noted, question proctitis No potentially large hemorrhoids could cause a similar appearance.  Stomach and remaining bowel loops normal appearance.  Normal appendix.  Umbilical hernia containing fat.  No additional mass, free fluid, or free air.  Prostatic enlargement.  Multilevel degenerative disc and facet disease changes primarily at lower lumbar spine.  IMPRESSION: Umbilical and RIGHT inguinal hernias containing fat.  LEFT inguinal hernia containing a segment of proximal sigmoid colon, with wall thickening of the involved and more proximal sigmoid colon which likely represents edema and incarceration of the sigmoid colon within the hernia sac.  Enhancement of rectal wall question proctitis.  Retroperitoneal, pelvic, retrocrural in inferior mediastinal adenopathy of uncertain etiology.  Differential diagnosis would include lymphoma, metastatic disease, unlikely reactive process.  Findings discussed with Evalee Jefferson PA in ED on 07/23/2014 at 1205 hours.   Electronically Signed   By: Lavonia Dana M.D.   On: 07/23/2014 12:06     EKG Interpretation None      MDM   Final diagnoses:  Inguinal hernia with incarceration  Adenopathy    Patients labs  and/or radiological studies were viewed and considered during the medical decision making and disposition process. Pt was also seen by Dr Christy Gentles during this visit. Discussed case with Dr. Arnoldo Morale who saw patient in ed and reduced his hernia.  Advised outpatient f/u to arrange elective surgery once he is medically cleared.  Lymph node findings discussed with patient including need for close f/u with pcp to further evaluate.  Pt still with generalized abdominal discomfort, but improved.  Offered admission which pt deferred.  He was prescribed hydrocodone for pain relief,  Cautioned re sedation.  He will call Dr Willey Blade for an office visit this week for further eval.  Also to call Dr Arnoldo Morale for  further management of hernia.  Pt and friend at bedside understand and agrees with plan.    Evalee Jefferson, PA-C 07/25/14 [phone]

## 2014-07-23 NOTE — Discharge Instructions (Signed)
Lymphadenopathy °Lymphadenopathy means "disease of the lymph glands." But the term is usually used to describe swollen or enlarged lymph glands, also called lymph nodes. These are the bean-shaped organs found in many locations including the neck, underarm, and groin. Lymph glands are part of the immune system, which fights infections in your body. Lymphadenopathy can occur in just one area of the body, such as the neck, or it can be generalized, with lymph node enlargement in several areas. The nodes found in the neck are the most common sites of lymphadenopathy. °CAUSES °When your immune system responds to germs (such as viruses or bacteria ), infection-fighting cells and fluid build up. This causes the glands to grow in size. Usually, this is not something to worry about. Sometimes, the glands themselves can become infected and inflamed. This is called lymphadenitis. °Enlarged lymph nodes can be caused by many diseases: °· Bacterial disease, such as strep throat or a skin infection. °· Viral disease, such as a common cold. °· Other germs, such as Lyme disease, tuberculosis, or sexually transmitted diseases. °· Cancers, such as lymphoma (cancer of the lymphatic system) or leukemia (cancer of the white blood cells). °· Inflammatory diseases such as lupus or rheumatoid arthritis. °· Reactions to medications. °Many of the diseases above are rare, but important. This is why you should see your caregiver if you have lymphadenopathy. °SYMPTOMS °· Swollen, enlarged lumps in the neck, back of the head, or other locations. °· Tenderness. °· Warmth or redness of the skin over the lymph nodes. °· Fever. °DIAGNOSIS °Enlarged lymph nodes are often near the source of infection. They can help health care providers diagnose your illness. For instance: °· Swollen lymph nodes around the jaw might be caused by an infection in the mouth. °· Enlarged glands in the neck often signal a throat infection. °· Lymph nodes that are swollen in  more than one area often indicate an illness caused by a virus. °Your caregiver will likely know what is causing your lymphadenopathy after listening to your history and examining you. Blood tests, x-rays, or other tests may be needed. If the cause of the enlarged lymph node cannot be found, and it does not go away by itself, then a biopsy may be needed. Your caregiver will discuss this with you. °TREATMENT °Treatment for your enlarged lymph nodes will depend on the cause. Many times the nodes will shrink to normal size by themselves, with no treatment. Antibiotics or other medicines may be needed for infection. Only take over-the-counter or prescription medicines for pain, discomfort, or fever as directed by your caregiver. °HOME CARE INSTRUCTIONS °Swollen lymph glands usually return to normal when the underlying medical condition goes away. If they persist, contact your health-care provider. He/she might prescribe antibiotics or other treatments, depending on the diagnosis. Take any medications exactly as prescribed. Keep any follow-up appointments made to check on the condition of your enlarged nodes. °SEEK MEDICAL CARE IF: °· Swelling lasts for more than two weeks. °· You have symptoms such as weight loss, night sweats, fatigue, or fever that does not go away. °· The lymph nodes are hard, seem fixed to the skin, or are growing rapidly. °· Skin over the lymph nodes is red and inflamed. This could mean there is an infection. °SEEK IMMEDIATE MEDICAL CARE IF: °· Fluid starts leaking from the area of the enlarged lymph node. °· You develop a fever of 102° F (38.9° C) or greater. °· Severe pain develops (not necessarily at the site of a   large lymph node).  You develop chest pain or shortness of breath.  You develop worsening abdominal pain. MAKE SURE YOU:  Understand these instructions.  Will watch your condition.  Will get help right away if you are not doing well or get worse. Document Released:  09/08/2008 Document Revised: 04/16/2014 Document Reviewed: 09/08/2008 [facility] Patient Information 2015 [place], [place]. This information is not intended to replace advice given to you by your health care provider. Make sure you discuss any questions you have with your health care provider.  Inguinal Hernia, Adult Muscles help keep everything in the body in its proper place. But if a weak spot in the muscles develops, something can poke through. That is called a hernia. When this happens in the lower part of the belly (abdomen), it is called an inguinal hernia. (It takes its name from a part of the body in this region called the inguinal canal.) A weak spot in the wall of muscles lets some fat or part of the small intestine bulge through. An inguinal hernia can develop at any age. Men get them more often than women. CAUSES  In adults, an inguinal hernia develops over time.  It can be triggered by:  Suddenly straining the muscles of the lower abdomen.  Lifting heavy objects.  Straining to have a bowel movement. Difficult bowel movements (constipation) can lead to this.  Constant coughing. This may be caused by smoking or lung disease.  Being overweight.  Being pregnant.  Working at a job that requires long periods of standing or heavy lifting.  Having had an inguinal hernia before. One type can be an emergency situation. It is called a strangulated inguinal hernia. It develops if part of the small intestine slips through the weak spot and cannot get back into the abdomen. The blood supply can be cut off. If that happens, part of the intestine may die. This situation requires emergency surgery. SYMPTOMS  Often, a small inguinal hernia has no symptoms. It is found when a healthcare provider does a physical exam. Larger hernias usually have symptoms.   In adults, symptoms may include:  A lump in the groin. This is easier to see when the person is standing. It might disappear when lying  down.  In men, a lump in the scrotum.  Pain or burning in the groin. This occurs especially when lifting, straining or coughing.  A dull ache or feeling of pressure in the groin.  Signs of a strangulated hernia can include:  A bulge in the groin that becomes very painful and tender to the touch.  A bulge that turns red or purple.  Fever, nausea and vomiting.  Inability to have a bowel movement or to pass gas. DIAGNOSIS  To decide if you have an inguinal hernia, a healthcare provider will probably do a physical examination.  This will include asking questions about any symptoms you have noticed.  The healthcare provider might feel the groin area and ask you to cough. If an inguinal hernia is felt, the healthcare provider may try to slide it back into the abdomen.  Usually no other tests are needed. TREATMENT  Treatments can vary. The size of the hernia makes a difference. Options include:  Watchful waiting. This is often suggested if the hernia is small and you have had no symptoms.  No medical procedure will be done unless symptoms develop.  You will need to watch closely for symptoms. If any occur, contact your healthcare provider right away.  Surgery. This  is used if the hernia is larger or you have symptoms.  Open surgery. This is usually an outpatient procedure (you will not stay overnight in a hospital). An cut (incision) is made through the skin in the groin. The hernia is put back inside the abdomen. The weak area in the muscles is then repaired by herniorrhaphy or hernioplasty. Herniorrhaphy: in this type of surgery, the weak muscles are sewn back together. Hernioplasty: a patch or mesh is used to close the weak area in the abdominal wall.  Laparoscopy. In this procedure, a surgeon makes small incisions. A thin tube with a tiny video camera (called a laparoscope) is put into the abdomen. The surgeon repairs the hernia with mesh by looking with the video camera and using  two long instruments. HOME CARE INSTRUCTIONS   After surgery to repair an inguinal hernia:  You will need to take pain medicine prescribed by your healthcare provider. Follow all directions carefully.  You will need to take care of the wound from the incision.  Your activity will be restricted for awhile. This will probably include no heavy lifting for several weeks. You also should not do anything too active for a few weeks. When you can return to work will depend on the type of job that you have.  During "watchful waiting" periods, you should:  Maintain a healthy weight.  Eat a diet high in fiber (fruits, vegetables and whole grains).  Drink plenty of fluids to avoid constipation. This means drinking enough water and other liquids to keep your urine clear or pale yellow.  Do not lift heavy objects.  Do not stand for long periods of time.  Quit smoking. This should keep you from developing a frequent cough. SEEK MEDICAL CARE IF:   A bulge develops in your groin area.  You feel pain, a burning sensation or pressure in the groin. This might be worse if you are lifting or straining.  You develop a fever of more than 100.5 F (38.1 C). SEEK IMMEDIATE MEDICAL CARE IF:   Pain in the groin increases suddenly.  A bulge in the groin gets bigger suddenly and does not go down.  For men, there is sudden pain in the scrotum. Or, the size of the scrotum increases.  A bulge in the groin area becomes red or purple and is painful to touch.  You have nausea or vomiting that does not go away.  You feel your heart beating much faster than normal.  You cannot have a bowel movement or pass gas.  You develop a fever of more than 102.0 F (38.9 C). Document Released: 04/18/2009 Document Revised: 02/22/2012 Document Reviewed: 04/18/2009 [facility] Patient Information 2015 [place], [place]. This information is not intended to replace advice given to you by your health care provider. Make sure  you discuss any questions you have with your health care provider.

## 2014-07-23 NOTE — ED Provider Notes (Signed)
Patient seen/examined in the Emergency Department in conjunction with Midlevel Provider [facility] Patient reports abdominal pain Exam : diffuse lower abdominal tenderness Plan: CT imaging pending    Sharyon Cable, MD 07/23/14 1028

## 2014-07-23 NOTE — ED Notes (Signed)
Pt c/o pain in mid to lower abd and intermittent pain across lower back since yesterday.  Reports history of kidney stones.  Reports vomiting last night.

## 2014-07-24 ENCOUNTER — Other Ambulatory Visit (HOSPITAL_COMMUNITY): Payer: Self-pay | Admitting: Internal Medicine

## 2014-07-24 DIAGNOSIS — R599 Enlarged lymph nodes, unspecified: Secondary | ICD-10-CM

## 2014-07-25 NOTE — ED Provider Notes (Signed)
Medical screening examination/treatment/procedure(s) were conducted as a shared visit with non-physician practitioner(s) and myself.  I personally evaluated the patient during the encounter.   EKG Interpretation None        Sharyon Cable, MD 07/25/14 [phone]

## 2014-07-26 ENCOUNTER — Ambulatory Visit (HOSPITAL_COMMUNITY)
Admission: RE | Admit: 2014-07-26 | Discharge: 2014-07-26 | Disposition: A | Discharge status: Home / Self Care | Payer: Medicare Other | Source: Ambulatory Visit | Attending: Internal Medicine | Admitting: Internal Medicine

## 2014-07-26 ENCOUNTER — Encounter (HOSPITAL_COMMUNITY): Payer: Self-pay

## 2014-07-26 DIAGNOSIS — K2100 Gastro-esophageal reflux disease with esophagitis, without bleeding: Secondary | ICD-10-CM | POA: Diagnosis not present

## 2014-07-26 DIAGNOSIS — R599 Enlarged lymph nodes, unspecified: Secondary | ICD-10-CM

## 2014-07-26 DIAGNOSIS — K920 Hematemesis: Secondary | ICD-10-CM | POA: Diagnosis not present

## 2014-07-26 LAB — GLUCOSE, CAPILLARY: GLUCOSE-CAPILLARY: 143 mg/dL — AB (ref 70–99)

## 2014-07-26 MED ORDER — FLUDEOXYGLUCOSE F - 18 (FDG) INJECTION: 8.5000 | Freq: Once | INTRAVENOUS | Status: AC | PRN

## 2014-07-28 ENCOUNTER — Emergency Department (INDEPENDENT_AMBULATORY_CARE_PROVIDER_SITE_OTHER)
Admission: EM | Admit: 2014-07-28 | Discharge: 2014-07-28 | Disposition: A | Discharge status: Other Acute Inpatient Hospital | Payer: Medicare Other | Source: Home / Self Care

## 2014-07-28 ENCOUNTER — Emergency Department (HOSPITAL_COMMUNITY): Payer: Medicare Other

## 2014-07-28 ENCOUNTER — Inpatient Hospital Stay (HOSPITAL_COMMUNITY)
Admission: EM | Admit: 2014-07-28 | Discharge: 2014-07-30 | DRG: 392 | Disposition: A | Discharge status: Home / Self Care | Payer: Medicare Other | Attending: Internal Medicine | Admitting: Internal Medicine

## 2014-07-28 ENCOUNTER — Encounter (HOSPITAL_COMMUNITY): Payer: Self-pay | Admitting: Emergency Medicine

## 2014-07-28 ENCOUNTER — Inpatient Hospital Stay (HOSPITAL_COMMUNITY): Payer: Medicare Other

## 2014-07-28 DIAGNOSIS — R142 Eructation: Secondary | ICD-10-CM

## 2014-07-28 DIAGNOSIS — K449 Diaphragmatic hernia without obstruction or gangrene: Secondary | ICD-10-CM | POA: Diagnosis present

## 2014-07-28 DIAGNOSIS — R110 Nausea: Secondary | ICD-10-CM

## 2014-07-28 DIAGNOSIS — E876 Hypokalemia: Secondary | ICD-10-CM | POA: Diagnosis present

## 2014-07-28 DIAGNOSIS — K2100 Gastro-esophageal reflux disease with esophagitis, without bleeding: Secondary | ICD-10-CM | POA: Diagnosis present

## 2014-07-28 DIAGNOSIS — R112 Nausea with vomiting, unspecified: Secondary | ICD-10-CM

## 2014-07-28 DIAGNOSIS — K922 Gastrointestinal hemorrhage, unspecified: Secondary | ICD-10-CM | POA: Diagnosis present

## 2014-07-28 DIAGNOSIS — K920 Hematemesis: Secondary | ICD-10-CM | POA: Diagnosis present

## 2014-07-28 DIAGNOSIS — C775 Secondary and unspecified malignant neoplasm of intrapelvic lymph nodes: Secondary | ICD-10-CM

## 2014-07-28 DIAGNOSIS — R109 Unspecified abdominal pain: Secondary | ICD-10-CM

## 2014-07-28 DIAGNOSIS — C61 Malignant neoplasm of prostate: Secondary | ICD-10-CM | POA: Diagnosis present

## 2014-07-28 DIAGNOSIS — K4020 Bilateral inguinal hernia, without obstruction or gangrene, not specified as recurrent: Secondary | ICD-10-CM | POA: Diagnosis present

## 2014-07-28 DIAGNOSIS — R1310 Dysphagia, unspecified: Secondary | ICD-10-CM | POA: Diagnosis present

## 2014-07-28 DIAGNOSIS — R638 Other symptoms and signs concerning food and fluid intake: Secondary | ICD-10-CM

## 2014-07-28 DIAGNOSIS — R1030 Lower abdominal pain, unspecified: Secondary | ICD-10-CM

## 2014-07-28 DIAGNOSIS — R141 Gas pain: Secondary | ICD-10-CM

## 2014-07-28 DIAGNOSIS — R140 Abdominal distension (gaseous): Secondary | ICD-10-CM

## 2014-07-28 DIAGNOSIS — R599 Enlarged lymph nodes, unspecified: Secondary | ICD-10-CM

## 2014-07-28 DIAGNOSIS — R143 Flatulence: Secondary | ICD-10-CM

## 2014-07-28 DIAGNOSIS — R933 Abnormal findings on diagnostic imaging of other parts of digestive tract: Secondary | ICD-10-CM

## 2014-07-28 DIAGNOSIS — R591 Generalized enlarged lymph nodes: Secondary | ICD-10-CM

## 2014-07-28 DIAGNOSIS — D72829 Elevated white blood cell count, unspecified: Secondary | ICD-10-CM | POA: Diagnosis present

## 2014-07-28 HISTORY — DX: Congenital malformation, unspecified: Q89.9

## 2014-07-28 HISTORY — DX: Malignant neoplasm of prostate: C61

## 2014-07-28 LAB — TYPE AND SCREEN
ABO/RH(D): O POS
ANTIBODY SCREEN: NEGATIVE

## 2014-07-28 LAB — CBC
HCT: 42.8 % (ref 39.0–52.0)
Hemoglobin: 14.6 g/dL (ref 13.0–17.0)
MCH: 30.2 pg (ref 26.0–34.0)
MCHC: 34.1 g/dL (ref 30.0–36.0)
MCV: 88.6 fL (ref 78.0–100.0)
PLATELETS: 221 10*3/uL (ref 150–400)
RBC: 4.83 MIL/uL (ref 4.22–5.81)
RDW: 13.7 % (ref 11.5–15.5)
WBC: 9 10*3/uL (ref 4.0–10.5)

## 2014-07-28 LAB — COMPREHENSIVE METABOLIC PANEL
ALBUMIN: 2.2 g/dL — AB (ref 3.5–5.2)
ALT: 32 U/L (ref 0–53)
AST: 32 U/L (ref 0–37)
Alkaline Phosphatase: 118 U/L — ABNORMAL HIGH (ref 39–117)
Anion gap: 14 (ref 5–15)
BUN: 26 mg/dL — ABNORMAL HIGH (ref 6–23)
CO2: 23 mEq/L (ref 19–32)
Calcium: 8.5 mg/dL (ref 8.4–10.5)
Chloride: 102 mEq/L (ref 96–112)
Creatinine, Ser: 0.97 mg/dL (ref 0.50–1.35)
GFR calc non Af Amer: 78 mL/min — ABNORMAL LOW (ref >90)
GFR, EST AFRICAN AMERICAN: 90 mL/min — AB (ref >90)
GLUCOSE: 134 mg/dL — AB (ref 70–99)
POTASSIUM: 3.1 meq/L — AB (ref 3.7–5.3)
SODIUM: 139 meq/L (ref 137–147)
TOTAL PROTEIN: 6.2 g/dL (ref 6.0–8.3)
Total Bilirubin: 1.1 mg/dL (ref 0.3–1.2)

## 2014-07-28 LAB — POC OCCULT BLOOD, ED: Fecal Occult Bld: POSITIVE — AB

## 2014-07-28 LAB — ABO/RH: ABO/RH(D): O POS

## 2014-07-28 MED ORDER — ONDANSETRON HCL 4 MG/2ML IJ SOLN
4.0000 mg | Freq: Once | INTRAMUSCULAR | Status: AC
  Administered 2014-07-28: 4 mg via INTRAVENOUS
  Filled 2014-07-28: qty 2

## 2014-07-28 MED ORDER — POTASSIUM CHLORIDE IN NACL 20-0.9 MEQ/L-% IV SOLN
INTRAVENOUS | Status: AC
  Administered 2014-07-28: 1000 mL via INTRAVENOUS
  Administered 2014-07-29: 75 mL/h via INTRAVENOUS
  Filled 2014-07-28 (×2): qty 1000

## 2014-07-28 MED ORDER — ACETAMINOPHEN 650 MG RE SUPP: 650.0000 mg | Freq: Four times a day (QID) | RECTAL | Status: DC | PRN

## 2014-07-28 MED ORDER — IOHEXOL 300 MG/ML  SOLN
50.0000 mL | Freq: Once | INTRAMUSCULAR | Status: AC | PRN
  Administered 2014-07-28: 50 mL via ORAL

## 2014-07-28 MED ORDER — SODIUM CHLORIDE 0.9 % IV SOLN
8.0000 mg/h | INTRAVENOUS | Status: DC
  Administered 2014-07-28 – 2014-07-29 (×3): 8 mg/h via INTRAVENOUS
  Filled 2014-07-28 (×8): qty 80

## 2014-07-28 MED ORDER — SODIUM CHLORIDE 0.9 % IV SOLN
80.0000 mg | Freq: Once | INTRAVENOUS | Status: DC
  Filled 2014-07-28: qty 80

## 2014-07-28 MED ORDER — HYDROMORPHONE HCL PF 1 MG/ML IJ SOLN
0.5000 mg | Freq: Once | INTRAMUSCULAR | Status: AC
  Administered 2014-07-28: 0.5 mg via INTRAVENOUS

## 2014-07-28 MED ORDER — ONDANSETRON HCL 4 MG/2ML IJ SOLN: 4.0000 mg | Freq: Four times a day (QID) | INTRAMUSCULAR | Status: DC | PRN

## 2014-07-28 MED ORDER — MORPHINE SULFATE 2 MG/ML IJ SOLN
1.0000 mg | INTRAMUSCULAR | Status: DC | PRN
  Administered 2014-07-29 – 2014-07-30 (×3): 1 mg via INTRAVENOUS
  Filled 2014-07-28 (×3): qty 1

## 2014-07-28 MED ORDER — ONDANSETRON HCL 4 MG/2ML IJ SOLN: 4.0000 mg | Freq: Once | INTRAMUSCULAR | Status: DC

## 2014-07-28 MED ORDER — LATANOPROST 0.005 % OP SOLN
1.0000 [drp] | Freq: Every day | OPHTHALMIC | Status: DC
  Administered 2014-07-29 (×2): 1 [drp] via OPHTHALMIC
  Filled 2014-07-28: qty 2.5

## 2014-07-28 MED ORDER — PANTOPRAZOLE SODIUM 40 MG IV SOLR
40.0000 mg | Freq: Once | INTRAVENOUS | Status: AC
  Administered 2014-07-28: 40 mg via INTRAVENOUS
  Filled 2014-07-28: qty 40

## 2014-07-28 MED ORDER — IOHEXOL 300 MG/ML  SOLN
100.0000 mL | Freq: Once | INTRAMUSCULAR | Status: AC | PRN
  Administered 2014-07-28: 100 mL via INTRAVENOUS

## 2014-07-28 MED ORDER — SODIUM CHLORIDE 0.9 % IV BOLUS (SEPSIS)
1000.0000 mL | Freq: Once | INTRAVENOUS | Status: AC
  Administered 2014-07-28: 1000 mL via INTRAVENOUS

## 2014-07-28 MED ORDER — SODIUM CHLORIDE 0.9 % IV SOLN
Freq: Once | INTRAVENOUS | Status: AC
  Administered 2014-07-28: 18:00:00 via INTRAVENOUS

## 2014-07-28 MED ORDER — ONDANSETRON HCL 4 MG PO TABS: 4.0000 mg | ORAL_TABLET | Freq: Four times a day (QID) | ORAL | Status: DC | PRN

## 2014-07-28 MED ORDER — HYDROMORPHONE HCL PF 1 MG/ML IJ SOLN
INTRAMUSCULAR | Status: AC
  Filled 2014-07-28: qty 1

## 2014-07-28 MED ORDER — ONDANSETRON HCL 4 MG/2ML IJ SOLN
INTRAMUSCULAR | Status: AC
  Filled 2014-07-28: qty 2

## 2014-07-28 MED ORDER — PANTOPRAZOLE SODIUM 40 MG IV SOLR: 40.0000 mg | Freq: Two times a day (BID) | INTRAVENOUS | Status: DC

## 2014-07-28 MED ORDER — HYDROMORPHONE HCL PF 1 MG/ML IJ SOLN
0.5000 mg | Freq: Once | INTRAMUSCULAR | Status: AC
  Administered 2014-07-28: 0.5 mg via INTRAVENOUS
  Filled 2014-07-28: qty 1

## 2014-07-28 MED ORDER — ACETAMINOPHEN 325 MG PO TABS
650.0000 mg | ORAL_TABLET | Freq: Four times a day (QID) | ORAL | Status: DC | PRN
  Administered 2014-07-29 – 2014-07-30 (×3): 650 mg via ORAL
  Filled 2014-07-28 (×4): qty 2

## 2014-07-28 NOTE — ED Notes (Signed)
Patient transported to CT 

## 2014-07-28 NOTE — H&P (Addendum)
[facility] History and Physical  Bill Padilla CHE:[id number] DOB: 1935/02/03 DOA: 07/28/2014  Referring physician: ER physician. PCP: Asencion Noble, MD   Chief Complaint: Throwing up blood.  HPI: Bill Padilla is a 79 y.o. male who was recently diagnosed with prostate cancer presented to the ER because of persistent nausea vomiting with hematemesis. As per patient's wife patient has not been able to eat anything since last Saturday one week ago because of persistent nausea and vomiting. Last few days patient has developed coffee-ground vomitus. Patient denies taking any NSAIDs. Has been having some nonspecific epigastric discomfort. Denies any chest pain or shortness of breath. Patient was originally scheduled to see Dr. Olevia Perches for patient has been having some dysphagia and also Dr. Harlow Asa for his inguinal hernia. CT abdomen and pelvis done in the ER does not show anything acute but does show ventral hernia and bilateral inguinal hernia with obstruction. Patient at this time is hemodynamically stable and will be admitted for further management.  Review of Systems: As presented in the history of presenting illness, rest negative.  Past Medical History  Diagnosis Date  . Kidney stones    History reviewed. No pertinent past surgical history. Social History:  reports that he has never smoked. He does not have any smokeless tobacco history on file. He reports that he does not drink alcohol or use illicit drugs. Where does patient live home. Can patient participate in ADLs? Yes.  No Known Allergies  Family History:  Family History  Problem Relation Age of Onset  . Colon cancer Sister       Prior to Admission medications   Medication Sig Start Date End Date Taking? Authorizing Provider  HYDROcodone-acetaminophen (NORCO/VICODIN) 5-325 MG per tablet Take 0.5-1 tablets by mouth every 4 (four) hours as needed for moderate pain. Hernia pain 07/23/14  Yes Almyra Free Idol, PA-C  latanoprost (XALATAN)  0.005 % ophthalmic solution Place 1 drop into both eyes at bedtime.   Yes Historical Provider, MD    Physical Exam: Filed Vitals:   07/28/14 1830 07/28/14 1844 07/28/14 1900 07/28/14 2008  BP:  122/60  128/61  Pulse: 89 89 54 76  Temp:  98.4 F (36.9 C)    TempSrc:  Oral    Resp: 14 15 16 16   SpO2: 97% 95% 96%      General:  Well-built and nourished.  Eyes: Anicteric no pallor.  ENT: No discharge from ears eyes nose mouth.  Neck: No mass felt.  Cardiovascular: S1-S2 heard.  Respiratory: No rhonchi or crepitations.  Abdomen: Soft nontender bowel sounds present. Bilateral inguinal hernia and ventral hernia present appears nonobstructed.  Skin: No rash.  Musculoskeletal: No edema.  Psychiatric: Appears normal.  Neurologic: Alert awake oriented to time place and person. Moves all extremities.  Labs on Admission:  Basic Metabolic Panel:  Recent Labs Lab 07/23/14 0902 07/28/14 1816  NA 140 139  K 5.1 3.1*  CL 102 102  CO2 24 23  GLUCOSE 191* 134*  BUN 26* 26*  CREATININE 1.09 0.97  CALCIUM 9.3 8.5   Liver Function Tests:  Recent Labs Lab 07/23/14 0902 07/28/14 1816  AST 29 32  ALT 23 32  ALKPHOS 95 118*  BILITOT 1.6* 1.1  PROT 8.0 6.2  ALBUMIN 3.6 2.2*   No results found for this basename: LIPASE, AMYLASE,  in the last 168 hours No results found for this basename: AMMONIA,  in the last 168 hours CBC:  Recent Labs Lab 07/23/14 0902 07/28/14 1816  WBC 22.5* 9.0  NEUTROABS 20.9*  --   HGB 16.2 14.6  HCT 46.9 42.8  MCV 88.2 88.6  PLT 231 221   Cardiac Enzymes: No results found for this basename: CKTOTAL, CKMB, CKMBINDEX, TROPONINI,  in the last 168 hours  BNP (last 3 results) No results found for this basename: PROBNP,  in the last 8760 hours CBG:  Recent Labs Lab 07/26/14 1226  GLUCAP 143*    Radiological Exams on Admission: Ct Abdomen Pelvis W Contrast  07/28/2014   CLINICAL DATA:  Recent diagnosis of prostate cancer. Abdominal  pain.  EXAM: CT ABDOMEN AND PELVIS WITH CONTRAST  TECHNIQUE: Multidetector CT imaging of the abdomen and pelvis was performed using the standard protocol following bolus administration of intravenous contrast.  CONTRAST:  50 mL OMNIPAQUE IOHEXOL 300 MG/ML SOLN, 100 mL OMNIPAQUE IOHEXOL 300 MG/ML SOLN  COMPARISON:  PET CT scan 07/26/2014. CT abdomen and pelvis 07/23/2014.  FINDINGS: Basilar atelectasis or scarring is identified. Oral contrast is present in the distal esophagus suggests reflux or esophageal dysmotility. Paraesophageal lymph nodes are again seen. Index node which had measured 1.5 cm short axis dimension is unchanged on image 13. There is no pleural or pericardial effusion.  The gallbladder, liver, adrenal glands and kidneys are unremarkable. A tiny hypo attenuating lesion the spleen is again seen and likely represents a cyst. Small fat containing ventral hernia is identified. The patient also has bilateral inguinal hernias. The hernia on the left contains a loop of colon without obstruction or other complicating feature, unchanged. The colon is otherwise unremarkable. Small bowel appears normal.  Retroperitoneal lymphadenopathy is again seen. Inter aortocaval node measuring 1.6 cm is unchanged on image 56. Lymph node along the inferior vena cava measuring 1.5 cm is unchanged on image 60 to. Right common iliac lymph node measuring 2.4 cm on image 68 is also unchanged. No focal bony abnormality is identified with lumbar spondylosis noted.  IMPRESSION: No acute finding abdomen or pelvis.  No change in paraesophageal and abdominal and pelvic lymphadenopathy.  Thickened walls of the distal esophagus is nonspecific but could be due to inflammatory change.  Midline fat containing ventral hernia and bilateral inguinal hernias. The hernia on the left contains loops of colon without obstruction or other complicating feature.   Electronically Signed   By: Inge Rise M.D.   On: 07/28/2014 20:55      Assessment/Plan Active Problems:   Hematemesis   Nausea & vomiting   GI bleed   1. Hematemesis - suspect upper GI bleed. Patient will be kept n.p.o. Protonix infusion with close followup of CBC. Type and screen. Consult GI in a.m. 2. Nausea and vomiting - cause not clear. Patient does have bilateral inguinal hernia and if patient's nausea vomiting does not improve then may be surgical input. 3. Recently diagnosed prostate cancer - further workup as urologist per primary.  Addendum - I have consulted Dr.Brodie for GI.  Code Status: Full code.  Family Communication: Patient's wife and son at the bedside.  Disposition Plan: Admit to inpatient.    Jacoba Cherney N. [facility] Pager [phone].  If 7PM-7AM, please contact night-coverage [email or web address] Password [facility] 07/28/2014, 10:09 PM

## 2014-07-28 NOTE — ED Provider Notes (Signed)
CSN: [id number]     Arrival date & time 07/28/14  1616 History   First MD Initiated Contact with Patient 07/28/14 1638     Chief Complaint  Patient presents with  . Abdominal Pain   (Consider location/radiation/quality/duration/timing/severity/associated sxs/prior Treatment) HPI Comments: 79 year old male with recent diagnosis of prostate cancer with generalized lymphadenopathy and 2 inguinal hernias and one umbilical hernia is complaining of abdominal and chest pain. He has had almost no by mouth intake in the past week. He is complaining of feeling miserable, nauseated and having pain. He was seen at [facility] approximately one week ago with similar complaints however he has continued to worsen with dehydration,pain,poor intake and now vomiting dark coffe ground guiac positive emesis.   Past Medical History  Diagnosis Date  . Kidney stones    History reviewed. No pertinent past surgical history. History reviewed. No pertinent family history. History  Substance Use Topics  . Smoking status: Never Smoker   . Smokeless tobacco: Not on file  . Alcohol Use: No    Review of Systems  Constitutional: Positive for fever, activity change, appetite change and fatigue.  HENT: Negative for congestion, drooling, ear pain, sore throat and trouble swallowing.   Eyes: Negative.   Respiratory: Negative for cough, shortness of breath and wheezing.   Cardiovascular: Positive for chest pain.  Gastrointestinal: Positive for nausea, vomiting, abdominal pain and abdominal distention.  Musculoskeletal: Negative.   Skin: Positive for pallor.  Neurological: Negative for seizures, syncope, facial asymmetry and speech difficulty.    Allergies  Review of patient's allergies indicates no known allergies.  Home Medications   Prior to Admission medications   Medication Sig Start Date End Date Taking? Authorizing Provider  HYDROcodone-acetaminophen (NORCO/VICODIN) 5-325 MG per tablet 1/2 to 1  tablets every 4 hours if needed for abdominal pain 07/23/14   Evalee Jefferson, PA-C  latanoprost (XALATAN) 0.005 % ophthalmic solution Place 1 drop into both eyes at bedtime.    Historical Provider, MD   BP 100/71  Pulse 88  Temp(Src) 100.6 F (38.1 C) (Oral) Physical Exam  Nursing note and vitals reviewed. Constitutional: He is oriented to person, place, and time.  Alert, poorly nourished, mildly obese, weakened state. Appears moderately ill.  Eyes: Conjunctivae and EOM are normal.  Neck: Normal range of motion. Neck supple.  Cardiovascular: Normal rate, regular rhythm and intact distal pulses.   Pulmonary/Chest: Effort normal and breath sounds normal. No respiratory distress.  Abdominal:  Hypo active BS. Few high pitch tinkling. Abd distended and percusses tympanic. Tender in the lower most abdomen  Neurological: He is alert and oriented to person, place, and time. He exhibits normal muscle tone.  Skin: Skin is warm and dry. There is pallor.    ED Course  Procedures (including critical care time) Labs Review Labs Reviewed - No data to display  Imaging Review No results found.   MDM   1. Hematemesis with nausea   2. Lower abdominal pain   3. Abdominal distension   4. Generalized lymphadenopathy   5. Prostate cancer metastatic to intrapelvic lymph node   6. Poor fluid intake    IV Dilaudid 0.5 mg iv Transfer to [place] for evaluation of hemetemesis, poor po intake, fever, generalized lymphadenopathy with recent dx of prostate cancer, pain control        Janne Napoleon, NP 07/28/14 1717

## 2014-07-28 NOTE — ED Notes (Signed)
Bed: WA17 Expected date: 07/28/14 Expected time:  Means of arrival:  Comments: GI Bleed  Ca with mets recently dx

## 2014-07-28 NOTE — ED Notes (Addendum)
Discussion of history w patient/wife. Reportedly has had generalized abdominal pain, nausea x 1 week, not eaten x 1 week. Recent evaluation at [facility] and by PCP via [facility] and a PET scan. Wife states they have been informed he has prostate cancer which is spreading into his lymph nodes. . Has not yet seen GI for referral for evaluation of not eating or drinking. Minimal function of right arm/hand due to related issues from birth. Temp 11.6. D Mabe, NP , advised of condition. Just after assessment, pt produced black , coffee ground material, D Mabe advised

## 2014-07-28 NOTE — ED Provider Notes (Signed)
CSN: [id number]     Arrival date & time 07/28/14  1755 History   First MD Initiated Contact with Patient 07/28/14 1837     Chief Complaint  Patient presents with  . Hemoptysis     (Consider location/radiation/quality/duration/timing/severity/associated sxs/prior Treatment) Patient is a 79 y.o. male presenting with vomiting.  Emesis Severity:  Moderate Duration:  1 week Timing:  Constant Emesis appearance: coffee grounds. How soon after eating does vomiting occur:  1 hour Progression:  Worsening Chronicity:  New Relieved by:  Nothing Worsened by:  Nothing tried Ineffective treatments:  None tried Associated symptoms: abdominal pain   Associated symptoms: no arthralgias, no chills, no cough, no diarrhea, no fever, no headaches and no sore throat     Past Medical History  Diagnosis Date  . Kidney stones    History reviewed. No pertinent past surgical history. History reviewed. No pertinent family history. History  Substance Use Topics  . Smoking status: Never Smoker   . Smokeless tobacco: Not on file  . Alcohol Use: No    Review of Systems  Constitutional: Negative for fever and chills.  HENT: Negative for congestion, rhinorrhea and sore throat.   Eyes: Negative for photophobia and visual disturbance.  Respiratory: Negative for cough and shortness of breath.   Cardiovascular: Negative for chest pain and leg swelling.  Gastrointestinal: Positive for vomiting and abdominal pain. Negative for nausea, diarrhea and constipation.  Endocrine: Negative for polydipsia and polyuria.  Genitourinary: Negative for dysuria and hematuria.  Musculoskeletal: Negative for arthralgias and back pain.  Skin: Negative for color change and rash.  Neurological: Negative for dizziness, syncope, light-headedness and headaches.  Hematological: Negative for adenopathy. Does not bruise/bleed easily.  All other systems reviewed and are negative.     Allergies  Review of patient's allergies  indicates no known allergies.  Home Medications   Prior to Admission medications   Medication Sig Start Date End Date Taking? Authorizing Provider  HYDROcodone-acetaminophen (NORCO/VICODIN) 5-325 MG per tablet Take 0.5-1 tablets by mouth every 4 (four) hours as needed for moderate pain. Hernia pain 07/23/14  Yes Almyra Free Idol, PA-C  latanoprost (XALATAN) 0.005 % ophthalmic solution Place 1 drop into both eyes at bedtime.   Yes Historical Provider, MD   BP 128/61  Pulse 76  Temp(Src) 98.4 F (36.9 C) (Oral)  Resp 16  SpO2 96% Physical Exam  Vitals reviewed. Constitutional: He is oriented to person, place, and time. He appears well-developed and well-nourished.  HENT:  Head: Normocephalic and atraumatic.  Eyes: Conjunctivae and EOM are normal.  Neck: Normal range of motion. Neck supple.  Cardiovascular: Normal rate, regular rhythm and normal heart sounds.   Pulmonary/Chest: Effort normal and breath sounds normal. No respiratory distress.  Abdominal: He exhibits no distension. There is generalized tenderness. There is no rebound and no guarding.  Musculoskeletal: Normal range of motion.  Neurological: He is alert and oriented to person, place, and time.  Skin: Skin is warm and dry.    ED Course  Procedures (including critical care time) Labs Review Labs Reviewed  COMPREHENSIVE METABOLIC PANEL - Abnormal; Notable for the following:    Potassium 3.1 (*)    Glucose, Bld 134 (*)    BUN 26 (*)    Albumin 2.2 (*)    Alkaline Phosphatase 118 (*)    GFR calc non Af Amer 78 (*)    GFR calc Af Amer 90 (*)    All other components within normal limits  POC OCCULT BLOOD, ED - Abnormal;  Notable for the following:    Fecal Occult Bld POSITIVE (*)    All other components within normal limits  CBC  TYPE AND SCREEN  ABO/RH    Imaging Review Ct Abdomen Pelvis W Contrast  07/28/2014   CLINICAL DATA:  Recent diagnosis of prostate cancer. Abdominal pain.  EXAM: CT ABDOMEN AND PELVIS WITH  CONTRAST  TECHNIQUE: Multidetector CT imaging of the abdomen and pelvis was performed using the standard protocol following bolus administration of intravenous contrast.  CONTRAST:  50 mL OMNIPAQUE IOHEXOL 300 MG/ML SOLN, 100 mL OMNIPAQUE IOHEXOL 300 MG/ML SOLN  COMPARISON:  PET CT scan 07/26/2014. CT abdomen and pelvis 07/23/2014.  FINDINGS: Basilar atelectasis or scarring is identified. Oral contrast is present in the distal esophagus suggests reflux or esophageal dysmotility. Paraesophageal lymph nodes are again seen. Index node which had measured 1.5 cm short axis dimension is unchanged on image 13. There is no pleural or pericardial effusion.  The gallbladder, liver, adrenal glands and kidneys are unremarkable. A tiny hypo attenuating lesion the spleen is again seen and likely represents a cyst. Small fat containing ventral hernia is identified. The patient also has bilateral inguinal hernias. The hernia on the left contains a loop of colon without obstruction or other complicating feature, unchanged. The colon is otherwise unremarkable. Small bowel appears normal.  Retroperitoneal lymphadenopathy is again seen. Inter aortocaval node measuring 1.6 cm is unchanged on image 56. Lymph node along the inferior vena cava measuring 1.5 cm is unchanged on image 60 to. Right common iliac lymph node measuring 2.4 cm on image 68 is also unchanged. No focal bony abnormality is identified with lumbar spondylosis noted.  IMPRESSION: No acute finding abdomen or pelvis.  No change in paraesophageal and abdominal and pelvic lymphadenopathy.  Thickened walls of the distal esophagus is nonspecific but could be due to inflammatory change.  Midline fat containing ventral hernia and bilateral inguinal hernias. The hernia on the left contains loops of colon without obstruction or other complicating feature.   Electronically Signed   By: Inge Rise M.D.   On: 07/28/2014 20:55     EKG Interpretation None      MDM    Final diagnoses:  None    79 y.o. male  with pertinent PMH of recent dx of prostate ca with likely bony and lymphatic mets presents with coffee ground hemoptysis x 1 week.  On arrival vitals and physical exam as above.  CT scan for abd tenderness unremarkable.  Hernias reducible.  Consulted hospitalist for admission for urgent GI scoping.    Labs and imaging as above reviewed.   1. Hematemesis with nausea   2. Nausea and vomiting, vomiting of unspecified type   3. Hypokalemia   4. Leukocytosis, unspecified   5. Bilateral inguinal hernia without obstruction or gangrene, recurrence not specified         Debby Freiberg, MD 07/29/14 1155

## 2014-07-28 NOTE — ED Provider Notes (Signed)
Medical screening examination/treatment/procedure(s) were performed by resident physician or non-physician practitioner and as supervising physician I was immediately available for consultation/collaboration.   Pauline Good MD.   Billy Fischer, MD 07/28/14 [place]ly [place]

## 2014-07-28 NOTE — ED Notes (Signed)
Urine Specimen collected, in room with Pt label.

## 2014-07-28 NOTE — ED Notes (Signed)
Pt arrived via [facility] from urgent care. Recent dx Prostate Ca with mets, poor appetite and developing edema over last week. Now vomiting coffee ground emesis positive for blood.

## 2014-07-29 ENCOUNTER — Encounter (HOSPITAL_COMMUNITY)
Admission: EM | Disposition: A | Discharge status: Home / Self Care | Payer: Self-pay | Source: Home / Self Care | Attending: Internal Medicine

## 2014-07-29 ENCOUNTER — Inpatient Hospital Stay (HOSPITAL_COMMUNITY): Payer: Medicare Other

## 2014-07-29 ENCOUNTER — Encounter (HOSPITAL_COMMUNITY): Payer: Self-pay | Admitting: Physician Assistant

## 2014-07-29 DIAGNOSIS — D72829 Elevated white blood cell count, unspecified: Secondary | ICD-10-CM

## 2014-07-29 DIAGNOSIS — K920 Hematemesis: Secondary | ICD-10-CM

## 2014-07-29 DIAGNOSIS — E876 Hypokalemia: Secondary | ICD-10-CM

## 2014-07-29 DIAGNOSIS — K4020 Bilateral inguinal hernia, without obstruction or gangrene, not specified as recurrent: Secondary | ICD-10-CM

## 2014-07-29 DIAGNOSIS — K2100 Gastro-esophageal reflux disease with esophagitis, without bleeding: Principal | ICD-10-CM

## 2014-07-29 DIAGNOSIS — R110 Nausea: Secondary | ICD-10-CM

## 2014-07-29 DIAGNOSIS — R112 Nausea with vomiting, unspecified: Secondary | ICD-10-CM

## 2014-07-29 DIAGNOSIS — R933 Abnormal findings on diagnostic imaging of other parts of digestive tract: Secondary | ICD-10-CM

## 2014-07-29 HISTORY — PX: ESOPHAGOGASTRODUODENOSCOPY: SHX5428

## 2014-07-29 LAB — COMPREHENSIVE METABOLIC PANEL
ALBUMIN: 1.9 g/dL — AB (ref 3.5–5.2)
ALK PHOS: 99 U/L (ref 39–117)
ALT: 28 U/L (ref 0–53)
AST: 23 U/L (ref 0–37)
Anion gap: 10 (ref 5–15)
BUN: 25 mg/dL — ABNORMAL HIGH (ref 6–23)
CO2: 27 mEq/L (ref 19–32)
Calcium: 8 mg/dL — ABNORMAL LOW (ref 8.4–10.5)
Chloride: 105 mEq/L (ref 96–112)
Creatinine, Ser: 1.07 mg/dL (ref 0.50–1.35)
GFR calc Af Amer: 75 mL/min — ABNORMAL LOW (ref >90)
GFR calc non Af Amer: 65 mL/min — ABNORMAL LOW (ref >90)
Glucose, Bld: 104 mg/dL — ABNORMAL HIGH (ref 70–99)
POTASSIUM: 4.2 meq/L (ref 3.7–5.3)
Sodium: 142 mEq/L (ref 137–147)
Total Bilirubin: 0.9 mg/dL (ref 0.3–1.2)
Total Protein: 5.5 g/dL — ABNORMAL LOW (ref 6.0–8.3)

## 2014-07-29 LAB — TROPONIN I: Troponin I: <0.3 ng/mL (ref <0.30)

## 2014-07-29 LAB — GLUCOSE, CAPILLARY
GLUCOSE-CAPILLARY: 80 mg/dL (ref 70–99)
GLUCOSE-CAPILLARY: 83 mg/dL (ref 70–99)
Glucose-Capillary: 82 mg/dL (ref 70–99)
Glucose-Capillary: 87 mg/dL (ref 70–99)
Glucose-Capillary: 94 mg/dL (ref 70–99)

## 2014-07-29 LAB — CBC
HCT: 40.7 % (ref 39.0–52.0)
HCT: 41.5 % (ref 39.0–52.0)
HCT: 41.4 % (ref 39.0–52.0)
HEMATOCRIT: 40.5 % (ref 39.0–52.0)
HEMOGLOBIN: 13.6 g/dL (ref 13.0–17.0)
Hemoglobin: 13.6 g/dL (ref 13.0–17.0)
Hemoglobin: 13.9 g/dL (ref 13.0–17.0)
Hemoglobin: 13.8 g/dL (ref 13.0–17.0)
MCH: 29.9 pg (ref 26.0–34.0)
MCH: 29.5 pg (ref 26.0–34.0)
MCH: 30 pg (ref 26.0–34.0)
MCH: 29.9 pg (ref 26.0–34.0)
MCHC: 33.6 g/dL (ref 30.0–36.0)
MCHC: 33.4 g/dL (ref 30.0–36.0)
MCHC: 33.5 g/dL (ref 30.0–36.0)
MCHC: 33.3 g/dL (ref 30.0–36.0)
MCV: 89 fL (ref 78.0–100.0)
MCV: 88.3 fL (ref 78.0–100.0)
MCV: 89.4 fL (ref 78.0–100.0)
MCV: 89.6 fL (ref 78.0–100.0)
PLATELETS: 200 10*3/uL (ref 150–400)
Platelets: 186 10*3/uL (ref 150–400)
Platelets: 194 10*3/uL (ref 150–400)
Platelets: 195 10*3/uL (ref 150–400)
RBC: 4.55 MIL/uL (ref 4.22–5.81)
RBC: 4.61 MIL/uL (ref 4.22–5.81)
RBC: 4.64 MIL/uL (ref 4.22–5.81)
RBC: 4.62 MIL/uL (ref 4.22–5.81)
RDW: 13.7 % (ref 11.5–15.5)
RDW: 13.8 % (ref 11.5–15.5)
RDW: 13.8 % (ref 11.5–15.5)
RDW: 14 % (ref 11.5–15.5)
WBC: 9.1 10*3/uL (ref 4.0–10.5)
WBC: 9.9 10*3/uL (ref 4.0–10.5)
WBC: 9.3 10*3/uL (ref 4.0–10.5)
WBC: 9.8 10*3/uL (ref 4.0–10.5)

## 2014-07-29 LAB — MRSA PCR SCREENING: MRSA by PCR: NEGATIVE

## 2014-07-29 SURGERY — EGD (ESOPHAGOGASTRODUODENOSCOPY)
Anesthesia: Moderate Sedation

## 2014-07-29 MED ORDER — FENTANYL CITRATE 0.05 MG/ML IJ SOLN
INTRAMUSCULAR | Status: AC
  Filled 2014-07-29: qty 2

## 2014-07-29 MED ORDER — BUTAMBEN-TETRACAINE-BENZOCAINE 2-2-14 % EX AERO
INHALATION_SPRAY | CUTANEOUS | Status: DC | PRN
  Administered 2014-07-29 (×2): 1 via TOPICAL

## 2014-07-29 MED ORDER — FLUCONAZOLE 100 MG PO TABS
100.0000 mg | ORAL_TABLET | Freq: Every day | ORAL | Status: DC
  Administered 2014-07-29 – 2014-07-30 (×2): 100 mg via ORAL
  Filled 2014-07-29 (×2): qty 1

## 2014-07-29 MED ORDER — FLUCONAZOLE 100 MG PO TABS: 100.0000 mg | ORAL_TABLET | Freq: Every day | ORAL | Status: DC

## 2014-07-29 MED ORDER — SODIUM CHLORIDE 0.9 % IV SOLN: INTRAVENOUS | Status: DC

## 2014-07-29 MED ORDER — SUCRALFATE 1 GM/10ML PO SUSP
1.0000 g | Freq: Four times a day (QID) | ORAL | Status: DC
  Administered 2014-07-29 – 2014-07-30 (×5): 1 g via ORAL
  Filled 2014-07-29 (×8): qty 10

## 2014-07-29 MED ORDER — MIDAZOLAM HCL 10 MG/2ML IJ SOLN
INTRAMUSCULAR | Status: DC | PRN
  Administered 2014-07-29: 1 mg via INTRAVENOUS
  Administered 2014-07-29: 2 mg via INTRAVENOUS

## 2014-07-29 MED ORDER — MIDAZOLAM HCL 10 MG/2ML IJ SOLN
INTRAMUSCULAR | Status: AC
  Filled 2014-07-29: qty 2

## 2014-07-29 MED ORDER — FENTANYL CITRATE 0.05 MG/ML IJ SOLN
INTRAMUSCULAR | Status: DC | PRN
  Administered 2014-07-29: 25 ug via INTRAVENOUS

## 2014-07-29 NOTE — Op Note (Signed)
[facility] [place], [address]   ENDOSCOPY PROCEDURE REPORT  PATIENT: Bill Padilla, Bill Padilla  MR#: [id number] BIRTHDATE: 03/10/1935 , 43  yrs. old GENDER: Male ENDOSCOPIST: Lafayette Dragon, MD REFERRED BY:  Dr Barton Dubois PROCEDURE DATE:  07/29/2014 PROCEDURE:  EGD w/ biopsy ASA CLASS:     Class III INDICATIONS:  hematemesis.  Abnormal CT scan consistent with either esophagitis or neoplasm in the distal esophagus.  Dysphagia.Marland Kitchen MEDICATIONS: These medications were titrated to patient response per physician's verbal order, Fentanyl 25 mcg IV, and Versed 3 mg IV TOPICAL ANESTHETIC: Cetacaine Spray  DESCRIPTION OF PROCEDURE: After the risks benefits and alternatives of the procedure were thoroughly explained, informed consent was obtained.  The Pentax Gastroscope [id number] endoscope was introduced through the mouth and advanced to the second portion of the duodenum. Without limitations.  The instrument was slowly withdrawn as the mucosa was fully examined.      Esophagus: entire esophageal mucosa was severely inflamed. There was diffuse edema from the proximal esophagus to GE junction at 35 cm from the incisors. There were ulcerations as well as friability and bleeding. The lumen was smaller due to edema. Multiple biopsies were obtained from the GE junction and separate biopsies were obtained from the esophagus. There was no definite stricture but the entire esophageal lumen was mildly constrictive due to edema. There was heavy  yellow exudate throughout the esophagus.  Stomach: there was a moderately large hiatal hernia measuring 4 cm which was nonreducible. Gastric folds were normal. Gastric antrum and pyloric outlet were unremarkable. There was no obstruction. Retroflexion of the endoscope revealed normal fundus and cardia Duodenum: duodenal bulb and descending duodenum was normal. The second and third portion duodenum showed no evidence of obstruction.  There was no retention of fluid in the duodenum or in the stomach[         The scope was then withdrawn from the patient and the procedure completed.  COMPLICATIONS: There were no complications. ENDOSCOPIC IMPRESSION:  1. severe grade 4 reflux esophagitis proximal mid and distal esophagus. Status post biopsies. Rule out Barrett's esophagus. Rule out Candida esophagitis superimposed on reflux esophagitis 2. 4 cm nonreducible hiatal hernia  RECOMMENDATIONS: 1.  Await biopsy results 2.  Anti-reflux regimen to be follow 3.  Continue PPI 4.  Carafate slurry 10 cc by mouth 4 times a day Patient may need modified barium swallow with speech pathologist to rule out aspiration Diflucan 100 mg daily x5 days  REPEAT EXAM: for EGD pending biopsy results.  eSigned:  Lafayette Dragon, MD 07/29/2014 12:15 PM   CC:  PATIENT NAME:  [name], [name] MR#: [id number]

## 2014-07-29 NOTE — Progress Notes (Signed)
I have received the consult regarding hematemesis and abnormal CT scan raising  A question of thickened esophagus. I will tentatively schedule pt for EGD  Today pending full consult.

## 2014-07-29 NOTE — Progress Notes (Signed)
[facility] PROGRESS NOTE  Bill Padilla RWE:[id number] DOB: [birth year] DOA: 07/28/2014 PCP: Asencion Noble, MD  Assessment/Plan: 1-nausea/vomiting and hematemesis: appears to be secondary to UGI (erosive gastritis, esophagitis, AVM, malignancy, peptic ulcer disease or malory weiss) -continue NPO status, ok for ice chips -continue IV PPI -continue PRN antiemetics -GI on board and planning for EGD later today  2-ventral and bilateral inguinal hernias: no obstruction appreciated on CT scan. Currently reducible. -needs follow up with general surgery for timing regarding repairs.  3-leukocytosis: due to demargination; WBC's WNL again after hydraiton  4-new diagnosis of prostate cancer: s/p pet scan. -needs outpatient follow up with urology  5-dysphagia: EGD will be done and will help to ro strictures, masses or any other abnormalities -currently NPO  6-Hypokalemia: repleted  DVt: SCD's   Code Status: Full Family Communication: cousin at bedside Disposition Plan: to be determine.   Consultants: GI (Dr. Olevia Perches)  Procedures:  EGD: plan for 8/16  Antibiotics:  None   HPI/Subjective: Feeling ok and hungry. Patient is afebrile and denies any nausea or further vomiting.  Objective: Filed Vitals:   07/29/14 0726  BP: 133/71  Pulse: 88  Temp:   Resp: 21    Intake/Output Summary (Last 24 hours) at 07/29/14 1107 Last data filed at 07/29/14 1000  Gross per 24 hour  Intake 1004.58 ml  Output    500 ml  Net 504.58 ml   Filed Weights   07/28/14 2329  Weight: 87 kg (191 lb 12.8 oz)    Exam:   General:  NAD, hungry. Denies nausea, vomiting or further hematemesis episode. No fever  Cardiovascular: S1 and S2, no rubs or gallops  Respiratory: CTA bilaterally  Abdomen: positive BS; ventral hernia and bilateral inguinal hernia. No guarding and no pain currently  Musculoskeletal: no edema or cyanosis  Data Reviewed: Basic Metabolic Panel:  Recent Labs Lab  07/23/14 0902 07/28/14 1816 07/29/14 0245  NA 140 139 142  K 5.1 3.1* 4.2  CL 102 102 105  CO2 24 23 27   GLUCOSE 191* 134* 104*  BUN 26* 26* 25*  CREATININE 1.09 0.97 1.07  CALCIUM 9.3 8.5 8.0*   Liver Function Tests:  Recent Labs Lab 07/23/14 0902 07/28/14 1816 07/29/14 0245  AST 29 32 23  ALT 23 32 28  ALKPHOS 95 118* 99  BILITOT 1.6* 1.1 0.9  PROT 8.0 6.2 5.5*  ALBUMIN 3.6 2.2* 1.9*   CBC:  Recent Labs Lab 07/23/14 0902 07/28/14 1816 07/28/14 2257 07/29/14 0245 07/29/14 0647  WBC 22.5* 9.0 9.1 9.9 9.3  NEUTROABS 20.9*  --   --   --   --   HGB 16.2 14.6 13.6 13.6 13.9  HCT 46.9 42.8 40.5 40.7 41.5  MCV 88.2 88.6 89.0 88.3 89.4  PLT 231 221 186 194 195   Cardiac Enzymes:  Recent Labs Lab 07/28/14 2257  TROPONINI <0.30   CBG:  Recent Labs Lab 07/26/14 1226  GLUCAP 143*    Recent Results (from the past 240 hour(s))  MRSA PCR SCREENING     Status: None   Collection Time    07/28/14 11:25 PM      Result Value Ref Range Status   MRSA by PCR NEGATIVE  NEGATIVE Final   Comment:            The GeneXpert MRSA Assay (FDA     approved for NASAL specimens     only), is one component of a     comprehensive MRSA colonization  surveillance program. It is not     intended to diagnose MRSA     infection nor to guide or     monitor treatment for     MRSA infections.     Studies: Ct Head Wo Contrast  07/28/2014   CLINICAL DATA:  Nausea and vomiting.  Weakness.  EXAM: CT HEAD WITHOUT CONTRAST  TECHNIQUE: Contiguous axial images were obtained from the base of the skull through the vertex without intravenous contrast.  COMPARISON:  None.  FINDINGS: There is no evidence of acute infarction, mass lesion, or intra- or extra-axial hemorrhage on CT.  There is ex vacuo dilatation of the frontal horn of the left lateral ventricle, thought to reflect remote infarct.  The posterior fossa, including the cerebellum, brainstem and fourth ventricle, is within normal  limits. The basal ganglia are unremarkable in appearance. No mass effect or midline shift is seen.  There is no evidence of fracture; visualized osseous structures are unremarkable in appearance. The visualized portions of the orbits are within normal limits. The paranasal sinuses and mastoid air cells are well-aerated. No significant soft tissue abnormalities are seen.  IMPRESSION: 1. No acute intracranial pathology seen on CT. 2. Ex vacuo dilatation of the frontal horn of the left lateral ventricle is thought to reflect remote infarct.   Electronically Signed   By: Garald Balding M.D.   On: 07/28/2014 22:40   Ct Abdomen Pelvis W Contrast  07/28/2014   CLINICAL DATA:  Recent diagnosis of prostate cancer. Abdominal pain.  EXAM: CT ABDOMEN AND PELVIS WITH CONTRAST  TECHNIQUE: Multidetector CT imaging of the abdomen and pelvis was performed using the standard protocol following bolus administration of intravenous contrast.  CONTRAST:  50 mL OMNIPAQUE IOHEXOL 300 MG/ML SOLN, 100 mL OMNIPAQUE IOHEXOL 300 MG/ML SOLN  COMPARISON:  PET CT scan 07/26/2014. CT abdomen and pelvis 07/23/2014.  FINDINGS: Basilar atelectasis or scarring is identified. Oral contrast is present in the distal esophagus suggests reflux or esophageal dysmotility. Paraesophageal lymph nodes are again seen. Index node which had measured 1.5 cm short axis dimension is unchanged on image 13. There is no pleural or pericardial effusion.  The gallbladder, liver, adrenal glands and kidneys are unremarkable. A tiny hypo attenuating lesion the spleen is again seen and likely represents a cyst. Small fat containing ventral hernia is identified. The patient also has bilateral inguinal hernias. The hernia on the left contains a loop of colon without obstruction or other complicating feature, unchanged. The colon is otherwise unremarkable. Small bowel appears normal.  Retroperitoneal lymphadenopathy is again seen. Inter aortocaval node measuring 1.6 cm is  unchanged on image 56. Lymph node along the inferior vena cava measuring 1.5 cm is unchanged on image 60 to. Right common iliac lymph node measuring 2.4 cm on image 68 is also unchanged. No focal bony abnormality is identified with lumbar spondylosis noted.  IMPRESSION: No acute finding abdomen or pelvis.  No change in paraesophageal and abdominal and pelvic lymphadenopathy.  Thickened walls of the distal esophagus is nonspecific but could be due to inflammatory change.  Midline fat containing ventral hernia and bilateral inguinal hernias. The hernia on the left contains loops of colon without obstruction or other complicating feature.   Electronically Signed   By: Inge Rise M.D.   On: 07/28/2014 20:55   Dg Chest Port 1 View  07/29/2014   CLINICAL DATA:  Follow-up infiltrates  EXAM: PORTABLE CHEST - 1 VIEW  COMPARISON:  PET-CT 07/26/2014  FINDINGS: Stable enlarged cardiac silhouette.  There is fine interstitial pattern in the lungs. No focal consolidation. No pneumothorax.  IMPRESSION: Chronic interstitial lung disease.  No acute findings   Electronically Signed   By: Suzy Bouchard M.D.   On: 07/29/2014 07:15    Scheduled Meds: . latanoprost  1 drop Both Eyes QHS  . pantoprazole (PROTONIX) IV  80 mg Intravenous Once  . [START ON 08/01/2014] pantoprazole (PROTONIX) IV  40 mg Intravenous Q12H   Continuous Infusions: . 0.9 % NaCl with KCl 20 mEq / L 1,000 mL (07/28/14 2357)  . pantoprozole (PROTONIX) infusion 8 mg/hr (07/29/14 1000)    Active Problems:   Hematemesis   Nausea & vomiting   GI bleed   Nonspecific (abnormal) findings on radiological and other examination of gastrointestinal tract   Time spent: >30 minutes   Barton Dubois  [facility] Pager [phone]. If 7PM-7AM, please contact night-coverage at [email or web address], password [facility] 07/29/2014, 11:07 AM  LOS: 1 day

## 2014-07-29 NOTE — Plan of Care (Signed)
Problem: Phase I Progression Outcomes Goal: Pain controlled with appropriate interventions Outcome: Not Applicable Date Met:  99/83/38 Assessment (-) for pain, Pt stated 0 for pain scale. Goal: OOB as tolerated unless otherwise ordered Outcome: Completed/Met Date Met:  07/29/14 Pt has bathroom privileges, and stands at bedside to use urinal.   Goal: Voiding-avoid urinary catheter unless indicated Outcome: Completed/Met Date Met:  07/29/14 Utilizing urinal with no difficulties.   Problem: Phase II Progression Outcomes Goal: H&H stablized < 1gm drop in 24 hrs Outcome: Completed/Met Date Met:  07/29/14 Upon admission Hgb was within normal range 14.6.

## 2014-07-29 NOTE — Progress Notes (Signed)
Hand off report called to [facility], Therapist, sports.  Patient transferring to room 1434.

## 2014-07-29 NOTE — Consult Note (Signed)
[facility] Consult: 9:25 AM 07/29/2014  LOS: 1 day    Referring Provider: Dr Dyann Kief Primary Care Physician:  Asencion Noble, MD Primary Gastroenterologist:  unassigned    Reason for Consultation:  Coffee like emesis, dysphagia.    HPI: Bill Padilla is a 79 y.o. male.  Lives in [place] [place].  Hx kidney stone, left cataract surgery.  Known inguinal hernias. Congenital left limb weakness and deformity.  Presented to his local ED 8/10 with diffuse abdominal pain. CT on 8/10 showed bil inguinal hernias, left inguinal hernia containing segment of proximal colon, which appeared thickened.  Adenopathy in RP, pelvis, retrocural region: ? Lymphoma, metastatic disease. Retained contrast noted in esophagus. Followed up with PMD and had PET scan on 8/13.  This showed abnormal prostate,  paraesophageal, retrocrural, retroperitoneal, and pelvic lymphadenopathy, left ischial tuberosity sclerosis, thickened distal esophagus, opacity in right upper lung lobe.   Started using Vicodin for pain on 8/14 and then began to vomit, material was dark, not high volume and not frequently.  BMs were brown but are FOBT + in lab.    Pt has had less than one month of solid dysphagia.  No weight loss.  + anorexia for maybe 2 weeks.  No use of ASA or NSAIDs. Never had EGD or colonoscopy.  Due to persistent vomiting he came to ED last night.  Potassium low, not anemic.  CT repeated 8/15: stable adenopathy, thickened distal esophagus.  Bil inguinal hernias with non-obstructed loops colon within left hernia.   Past Medical History  Diagnosis Date  . Kidney stones   . Prostate cancer   . 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     Past Surgical History  Procedure Laterality Date  . Cataract extraction w/ intraocular  lens implant Left ~2011    vision blurry since the surgery.     Prior to Admission medications   Medication Sig Start Date End Date Taking? Authorizing Provider  HYDROcodone-acetaminophen (NORCO/VICODIN) 5-325 MG per tablet Take 0.5-1 tablets by mouth every 4 (four) hours as needed for moderate pain. Hernia pain 07/23/14  Yes Almyra Free Idol, PA-C  latanoprost (XALATAN) 0.005 % ophthalmic solution Place 1 drop into both eyes at bedtime.   Yes Historical Provider, MD    Scheduled Meds: . latanoprost  1 drop Both Eyes QHS  . pantoprazole (PROTONIX) IV  80 mg Intravenous Once  . [START ON 08/01/2014] pantoprazole (PROTONIX) IV  40 mg Intravenous Q12H   Infusions: . 0.9 % NaCl with KCl 20 mEq / L 1,000 mL (07/28/14 2357)  . pantoprozole (PROTONIX) infusion 8 mg/hr (07/29/14 0900)   PRN Meds: acetaminophen, acetaminophen, morphine injection, ondansetron (ZOFRAN) IV, ondansetron   Allergies as of 07/28/2014  . (No Known Allergies)    Family History  Problem Relation Age of Onset  . Colon cancer Sister     History   Social History  . Marital Status: Single, never married    Spouse Name: N/A    Number of Children: N/A  . Years of Education: N/A  Occupational History  . Not on file.   Social History Main Topics  . Smoking status: Never Smoker   . Smokeless tobacco: None  . Alcohol Use: None ever  . Drug Use: No  . Sexual Activity: Not on file   Social History Narrative  . Lives with his sister    REVIEW OF SYSTEMS: Constitutional:  Weight stable  Within 7 # in last 6 years ENT:  No nose bleeds Pulm:  No DOE, no cough CV:  No palpitations, no LE edema.  GU:  No hematuria, + frequency.  No weak stream, + nocturia.  GI:  Per HPI Heme:  No issues with abnormal bleeding or bruising   Transfusions:  None ever Neuro:  No headaches, no peripheral tingling or numbness.  Vision of left eye is blurry.  Derm:  No itching, no rash or sores.  Endocrine:  No sweats or chills.  No  polyuria or dysuria Immunization:  Not queried Travel:  None beyond local counties in last few months.    PHYSICAL EXAM: Vital signs in last 24 hours: Filed Vitals:   07/29/14 0726  BP: 133/71  Pulse: 88  Temp:   Resp: 21   Wt Readings from Last 3 Encounters:  07/28/14 87 kg (191 lb 12.8 oz)  07/28/14 87 kg (191 lb 12.8 oz)  07/23/14 88.451 kg (195 lb)    General: pleasant, overweight WM.  Comfortable. Head:  No asymmetry or swelling  Eyes:  No icterus or pallor.  Slight redness to sclera on left Ears:  Slightly HOH  Nose:  No congestion or discharge Mouth:  Clear, moist, no lesions, no blood Neck:  No mass, no bruits Lungs:  Clear bil.  Good BS.  No cough or dyspnea Heart: RRR.  No MRG Abdomen:  Soft, obese, hernias at umbilicus and both groins.   Rectal: deferred   Musc/Skeltl: no joint erythema.  Weakness and muscular atrophy in right arm and leg. Unable to sit up in bed without assist.  Extremities:  No CCE  Neurologic:  Oriented x 3.  Slow response time but accurate.  Weakness and muscular atrophy in right arm and leg. Skin:  No rash, sores.  No telangectasia Tattoos:  none Nodes:  No cervical adenopathy.   Psych:  Pleasant, relaxed, cooperative.   Intake/Output from previous day: 08/15 0701 - 08/16 0700 In: 704.6 [I.V.:704.6] Out: 500 [Urine:500] Intake/Output this shift: Total I/O In: 200 [I.V.:200] Out: -   LAB RESULTS:  Recent Labs  07/28/14 2257 07/29/14 0245 07/29/14 0647  WBC 9.1 9.9 9.3  HGB 13.6 13.6 13.9  HCT 40.5 40.7 41.5  PLT 186 194 195   BMET Lab Results  Component Value Date   NA 142 07/29/2014   NA 139 07/28/2014   NA 140 07/23/2014   K 4.2 07/29/2014   K 3.1* 07/28/2014   K 5.1 07/23/2014   CL 105 07/29/2014   CL 102 07/28/2014   CL 102 07/23/2014   CO2 27 07/29/2014   CO2 23 07/28/2014   CO2 24 07/23/2014   GLUCOSE 104* 07/29/2014   GLUCOSE 134* 07/28/2014   GLUCOSE 191* 07/23/2014   BUN 25* 07/29/2014   BUN 26* 07/28/2014   BUN  26* 07/23/2014   CREATININE 1.07 07/29/2014   CREATININE 0.97 07/28/2014   CREATININE 1.09 07/23/2014   CALCIUM 8.0* 07/29/2014   CALCIUM 8.5 07/28/2014   CALCIUM 9.3 07/23/2014   LFT  Recent Labs  07/28/14 1816 07/29/14 0245  PROT 6.2 5.5*  ALBUMIN 2.2* 1.9*  AST 32 23  ALT 32 28  ALKPHOS 118* 99  BILITOT 1.1 0.9   PT/INR No results found for this basename: INR,  PROTIME    RADIOLOGY STUDIES: Ct Head Wo Contrast  07/28/2014   CLINICAL DATA:  Nausea and vomiting.  Weakness.  EXAM: CT HEAD WITHOUT CONTRAST  TECHNIQUE: Contiguous axial images were obtained from the base of the skull through the vertex without intravenous contrast.  COMPARISON:  None.  FINDINGS: There is no evidence of acute infarction, mass lesion, or intra- or extra-axial hemorrhage on CT.  There is ex vacuo dilatation of the frontal horn of the left lateral ventricle, thought to reflect remote infarct.  The posterior fossa, including the cerebellum, brainstem and fourth ventricle, is within normal limits. The basal ganglia are unremarkable in appearance. No mass effect or midline shift is seen.  There is no evidence of fracture; visualized osseous structures are unremarkable in appearance. The visualized portions of the orbits are within normal limits. The paranasal sinuses and mastoid air cells are well-aerated. No significant soft tissue abnormalities are seen.  IMPRESSION: 1. No acute intracranial pathology seen on CT. 2. Ex vacuo dilatation of the frontal horn of the left lateral ventricle is thought to reflect remote infarct.   Electronically Signed   By: Garald Balding M.D.   On: 07/28/2014 22:40   Ct Abdomen Pelvis W Contrast  07/28/2014   CLINICAL DATA:  Recent diagnosis of prostate cancer. Abdominal pain.  EXAM: CT ABDOMEN AND PELVIS WITH CONTRAST  TECHNIQUE: Multidetector CT imaging of the abdomen and pelvis was performed using the standard protocol following bolus administration of intravenous contrast.  CONTRAST:   50 mL OMNIPAQUE IOHEXOL 300 MG/ML SOLN, 100 mL OMNIPAQUE IOHEXOL 300 MG/ML SOLN  COMPARISON:  PET CT scan 07/26/2014. CT abdomen and pelvis 07/23/2014.  FINDINGS: Basilar atelectasis or scarring is identified. Oral contrast is present in the distal esophagus suggests reflux or esophageal dysmotility. Paraesophageal lymph nodes are again seen. Index node which had measured 1.5 cm short axis dimension is unchanged on image 13. There is no pleural or pericardial effusion.  The gallbladder, liver, adrenal glands and kidneys are unremarkable. A tiny hypo attenuating lesion the spleen is again seen and likely represents a cyst. Small fat containing ventral hernia is identified. The patient also has bilateral inguinal hernias. The hernia on the left contains a loop of colon without obstruction or other complicating feature, unchanged. The colon is otherwise unremarkable. Small bowel appears normal.  Retroperitoneal lymphadenopathy is again seen. Inter aortocaval node measuring 1.6 cm is unchanged on image 56. Lymph node along the inferior vena cava measuring 1.5 cm is unchanged on image 60 to. Right common iliac lymph node measuring 2.4 cm on image 68 is also unchanged. No focal bony abnormality is identified with lumbar spondylosis noted.  IMPRESSION: No acute finding abdomen or pelvis.  No change in paraesophageal and abdominal and pelvic lymphadenopathy.  Thickened walls of the distal esophagus is nonspecific but could be due to inflammatory change.  Midline fat containing ventral hernia and bilateral inguinal hernias. The hernia on the left contains loops of colon without obstruction or other complicating feature.   Electronically Signed   By: Inge Rise M.D.   On: 07/28/2014 20:55   Dg Chest Port 1 View  07/29/2014   CLINICAL DATA:  Follow-up infiltrates  EXAM: PORTABLE CHEST - 1 VIEW  COMPARISON:  PET-CT 07/26/2014  FINDINGS: Stable enlarged cardiac silhouette. There is fine interstitial pattern  in the  lungs. No focal consolidation. No pneumothorax.  IMPRESSION: Chronic interstitial lung disease.  No acute findings   Electronically Signed   By: Suzy Bouchard M.D.   On: 07/29/2014 07:15    ENDOSCOPIC STUDIES: None ever  IMPRESSION:   *  coffee like emesis starting after narcotic anaelgesic initiated. FOBT +  *  Dysphagia to solid foods. Thickened esophagus and paraesophageal adenopathy on imaging.   *  New diagnoses of prostate cancer with extensive adenopathy.   *  Hypokalemia: corrected.   *  Slight azotemia.   *  Leukocytosis, resolved.     PLAN:     *  EGD today.  D/w pt and his cousin.    Azucena Freed  07/29/2014, 9:25 AM Pager: [phone] Attending MD note:   I have taken a history, examined the patient, and reviewed the chart. I agree with the Advanced Practitioner's impression and recommendations. UGIB suspicious for esophageal lesion, r/o malignant disease, or stricture or esophagitis. Agree with EGD  [facility] Pager # [phone]

## 2014-07-30 ENCOUNTER — Encounter (HOSPITAL_COMMUNITY): Payer: Self-pay | Admitting: Internal Medicine

## 2014-07-30 LAB — GLUCOSE, CAPILLARY
GLUCOSE-CAPILLARY: 114 mg/dL — AB (ref 70–99)
Glucose-Capillary: 85 mg/dL (ref 70–99)
Glucose-Capillary: 108 mg/dL — ABNORMAL HIGH (ref 70–99)

## 2014-07-30 MED ORDER — SUCRALFATE 1 GM/10ML PO SUSP: 1.0000 g | Freq: Four times a day (QID) | ORAL | Status: DC

## 2014-07-30 MED ORDER — PANTOPRAZOLE SODIUM 40 MG PO TBEC: 40.0000 mg | DELAYED_RELEASE_TABLET | Freq: Two times a day (BID) | ORAL | Status: DC

## 2014-07-30 MED ORDER — FLUCONAZOLE 100 MG PO TABS: 100.0000 mg | ORAL_TABLET | Freq: Every day | ORAL | Status: DC

## 2014-07-30 NOTE — Evaluation (Signed)
Clinical/Bedside Swallow Evaluation Patient Details  Name: Bill Padilla MRN: [id number] Date of Birth: 1935/01/11  Today's Date: 07/30/2014 Time: 8469-6295 SLP Time Calculation (min): 37 min  Past Medical History:  Past Medical History  Diagnosis Date  . Kidney stones   . Prostate cancer 07/2014  . 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    Past Surgical History:  Past Surgical History  Procedure Laterality Date  . Cataract extraction w/ intraocular lens implant Left ~2011    vision blurry since the surgery.   . Esophagogastroduodenoscopy N/A 07/29/2014    Procedure: ESOPHAGOGASTRODUODENOSCOPY (EGD);  Surgeon: Lafayette Dragon, MD;  Location: [facility];  Service: Endoscopy;  Laterality: N/A;   HPI:  79 year old male admitted 07/29/14 due to coffee-ground emesis and dysphagia. PMH significant for esophagitis, hiatal hernia. BSE ordered to evaluate swallow function and safety.   Assessment / Plan / Recommendation Clinical Impression  Pt's oral motor strength and function appear adequate. Slight cough noted following thin liquid via straw. Pt reported he is not used to using a straw.  No cough response with thin liquid via cup sip. No other overt s/s aspiration or clinical indication of airway compromise with other consistencies tested. Pt was given written information regarding management of esophageal dysmotility, and this was reviewed in depth with pt and wife. SLP provided opportunity to ask questions. Modified Barium Swallow is not felt to be necessary at this time, but may be completed as an outpatient if pharyngeal symptoms become problematic.  No further ST intervention recommended at this time. Please reconsult if needs arise. RN aware, MD paged with recommendations.    Aspiration Risk  Mild    Diet Recommendation Dysphagia 3 (Mechanical Soft);Thin liquid   Liquid Administration via: Cup;No straw Medication Administration: Whole meds with  liquid Supervision: Patient able to self feed Compensations: Slow rate;Small sips/bites;Follow solids with liquid Postural Changes and/or Swallow Maneuvers: Upright 30-60 min after meal;Seated upright 90 degrees    Other  Recommendations Oral Care Recommendations: Oral care BID   Follow Up Recommendations  None    Frequency and Duration        Pertinent Vitals/Pain VSS, abdominal pain 6/10. RN notified.    SLP Swallow Goals  n/a   Swallow Study Prior Functional Status   Recent history of n/v, reflux esophagitis, hiatal hernia. Pt on regular diet and thin liquids prior to admit.     General Date of Onset: 07/29/14 HPI: 79 year old male admitted 07/29/14 due to coffee-ground emesis and dysphagia. PMH significant for esophagitis, hiatal hernia. BSE ordered to evaluate swallow function and safety. Type of Study: Bedside swallow evaluation Previous Swallow Assessment: none Diet Prior to this Study: Thin liquids (clear liquids) Temperature Spikes Noted: No Respiratory Status: Room air History of Recent Intubation: No Behavior/Cognition: Alert;Cooperative;Pleasant mood Oral Cavity - Dentition: Adequate natural dentition Self-Feeding Abilities: Able to feed self Patient Positioning: Upright in bed Baseline Vocal Quality: Clear Volitional Cough: Strong Volitional Swallow: Able to elicit    Oral/Motor/Sensory Function Overall Oral Motor/Sensory Function: Appears within functional limits for tasks assessed   Ice Chips Ice chips: Within functional limits Presentation: Spoon   Thin Liquid Thin Liquid: Within functional limits Presentation: Straw;Cup    Nectar Thick Nectar Thick Liquid: Not tested   Honey Thick Honey Thick Liquid: Not tested   Puree Puree: Within functional limits Presentation: [name], CCC-SLP [phone] Solid: Within  functional limits Presentation: Self Fed       [name], Fredirick Maudlin 07/30/2014,11:27 AM

## 2014-07-30 NOTE — Progress Notes (Signed)
[facility] Progress Note  Subjective:  Tolerated clear liquids this morning.  No vomiting in a couple of days.  Complaining of some lower abdominal pain.  Objective:  Vital signs in last 24 hours: Temp:  [97.5 F (36.4 C)-98.8 F (37.1 C)] 98.3 F (36.8 C) (08/17 0432) Pulse Rate:  [59-94] 82 (08/17 0432) Resp:  [7-20] 19 (08/17 0432) BP: (101-162)/(55-119) 130/71 mmHg (08/17 0432) SpO2:  [93 %-100 %] 94 % (08/17 0432) Weight:  [191 lb 12.8 oz (87 kg)] 191 lb 12.8 oz (87 kg) (08/16 1625) Last BM Date: 07/30/14 General:  Alert, Well-developed, in NAD Heart:  Regular rate and rhythm; no murmurs Pulm:  CTAB.  No W/R/R. Abdomen:  Soft, non-distended. Normal bowel sounds.  Non-tender.  Non-tender umbilical hernia noted.   Extremities:  Without edema. Neurologic:  Alert and  oriented x4;  grossly normal neurologically. Psych:  Alert and cooperative. Normal mood and affect.  Intake/Output from previous day: 08/16 0701 - 08/17 0700 In: 2050 [P.O.:400; I.V.:1650] Out: 350 [Urine:350]  Lab Results:  Recent Labs  07/29/14 0245 07/29/14 0647 07/29/14 1130  WBC 9.9 9.3 9.8  HGB 13.6 13.9 13.8  HCT 40.7 41.5 41.4  PLT 194 195 200   BMET  Recent Labs  07/28/14 1816 07/29/14 0245  NA 139 142  K 3.1* 4.2  CL 102 105  CO2 23 27  GLUCOSE 134* 104*  BUN 26* 25*  CREATININE 0.97 1.07  CALCIUM 8.5 8.0*   LFT  Recent Labs  07/29/14 0245  PROT 5.5*  ALBUMIN 1.9*  AST 23  ALT 28  ALKPHOS 99  BILITOT 0.9   Ct Head Wo Contrast  07/28/2014   CLINICAL DATA:  Nausea and vomiting.  Weakness.  EXAM: CT HEAD WITHOUT CONTRAST  TECHNIQUE: Contiguous axial images were obtained from the base of the skull through the vertex without intravenous contrast.  COMPARISON:  None.  FINDINGS: There is no evidence of acute infarction, mass lesion, or intra- or extra-axial hemorrhage on CT.  There is ex vacuo dilatation of the frontal horn of the left lateral ventricle,  thought to reflect remote infarct.  The posterior fossa, including the cerebellum, brainstem and fourth ventricle, is within normal limits. The basal ganglia are unremarkable in appearance. No mass effect or midline shift is seen.  There is no evidence of fracture; visualized osseous structures are unremarkable in appearance. The visualized portions of the orbits are within normal limits. The paranasal sinuses and mastoid air cells are well-aerated. No significant soft tissue abnormalities are seen.  IMPRESSION: 1. No acute intracranial pathology seen on CT. 2. Ex vacuo dilatation of the frontal horn of the left lateral ventricle is thought to reflect remote infarct.   Electronically Signed   By: Garald Balding M.D.   On: 07/28/2014 22:40   Ct Abdomen Pelvis W Contrast  07/28/2014   CLINICAL DATA:  Recent diagnosis of prostate cancer. Abdominal pain.  EXAM: CT ABDOMEN AND PELVIS WITH CONTRAST  TECHNIQUE: Multidetector CT imaging of the abdomen and pelvis was performed using the standard protocol following bolus administration of intravenous contrast.  CONTRAST:  50 mL OMNIPAQUE IOHEXOL 300 MG/ML SOLN, 100 mL OMNIPAQUE IOHEXOL 300 MG/ML SOLN  COMPARISON:  PET CT scan 07/26/2014. CT abdomen and pelvis 07/23/2014.  FINDINGS: Basilar atelectasis or scarring is identified. Oral contrast is present in the distal esophagus suggests reflux or esophageal dysmotility. Paraesophageal lymph nodes are again seen. Index node which had measured 1.5 cm short axis dimension  is unchanged on image 13. There is no pleural or pericardial effusion.  The gallbladder, liver, adrenal glands and kidneys are unremarkable. A tiny hypo attenuating lesion the spleen is again seen and likely represents a cyst. Small fat containing ventral hernia is identified. The patient also has bilateral inguinal hernias. The hernia on the left contains a loop of colon without obstruction or other complicating feature, unchanged. The colon is otherwise  unremarkable. Small bowel appears normal.  Retroperitoneal lymphadenopathy is again seen. Inter aortocaval node measuring 1.6 cm is unchanged on image 56. Lymph node along the inferior vena cava measuring 1.5 cm is unchanged on image 60 to. Right common iliac lymph node measuring 2.4 cm on image 68 is also unchanged. No focal bony abnormality is identified with lumbar spondylosis noted.  IMPRESSION: No acute finding abdomen or pelvis.  No change in paraesophageal and abdominal and pelvic lymphadenopathy.  Thickened walls of the distal esophagus is nonspecific but could be due to inflammatory change.  Midline fat containing ventral hernia and bilateral inguinal hernias. The hernia on the left contains loops of colon without obstruction or other complicating feature.   Electronically Signed   By: Inge Rise M.D.   On: 07/28/2014 20:55   Dg Chest Port 1 View  07/29/2014   CLINICAL DATA:  Follow-up infiltrates  EXAM: PORTABLE CHEST - 1 VIEW  COMPARISON:  PET-CT 07/26/2014  FINDINGS: Stable enlarged cardiac silhouette. There is fine interstitial pattern in the lungs. No focal consolidation. No pneumothorax.  IMPRESSION: Chronic interstitial lung disease.  No acute findings   Electronically Signed   By: Suzy Bouchard M.D.   On: 07/29/2014 07:15    Assessment / Plan: -Severe grade 4 reflux esophagitis proximal, mid, and distal esophagus. Status post biopsies. Rule out Barrett's esophagus. Rule out Candida esophagitis superimposed on reflux esophagitis. -2. 4 cm nonreducible hiatal hernia - Inguinal hernia  *Await biopsy results. *Anti-reflux regimen; needs to remain upright for 2 hours after eating. *Continue PPI BID. On pantoprazole 40 mg IV BID currently. *Continue carafate slurry 10 cc by mouth 4 times a day for now. *Continue diflucan 100 mg daily empirically x 5 days. *Will leave him on clear liquids for now.   LOS: 2 days   ZEHR, JESSICA D.  07/30/2014, 9:07 AM  Pager number  [phone]     Attending physician's note   I have taken an interval history, reviewed the chart and examined the patient. I agree with the Advanced Practitioner's note, impression and recommendations. PPI BID long term, Carafate qid for 1-2 weeks, strict antireflux measures. Can advance diet slowly, as tolerated. Surgical consult for inguinal hernias.  Pricilla Riffle. Fuller Plan, MD [facility]

## 2014-07-30 NOTE — Discharge Summary (Signed)
Physician Discharge Summary  Slate Debroux WUJ:[id number] DOB: 1935/09/20 DOA: 07/28/2014  PCP: Asencion Noble, MD  Admit date: 07/28/2014 Discharge date: 07/30/2014  Time spent: >30 minutes  Recommendations for Outpatient Follow-up:  CBC to follow Hgb trend BMET to follow electrolytes and renal function  Discharge Diagnoses:  Active Problems:   Hematemesis   Nausea & vomiting   GI bleed   Nonspecific (abnormal) findings on radiological and other examination of gastrointestinal tract   Reflux esophagitis   Discharge Condition: Stable and improved. Patient will follow up with urology as an outpatient for further recommendations and treatment and also with Gen. surgery in to determine/schedule repairs of abdominal hernias.  Diet recommendation: Soft heart healthy diet.  Filed Weights   07/28/14 2329 07/29/14 1625  Weight: 87 kg (191 lb 12.8 oz) 87 kg (191 lb 12.8 oz)    History of present illness:  79 y.o. male who was recently diagnosed with prostate cancer presented to the ER because of persistent nausea vomiting with hematemesis. As per patient's wife patient has not been able to eat anything since last Saturday one week ago because of persistent nausea and vomiting. Last few days patient has developed coffee-ground vomitus. Patient denies taking any NSAIDs. Has been having some nonspecific epigastric discomfort. Denies any chest pain or shortness of breath. Patient was originally scheduled to see Dr. Olevia Perches for patient has been having some dysphagia and also Dr. Harlow Asa for his inguinal hernia. CT abdomen and pelvis done in the ER does not show anything acute but does show ventral hernia and bilateral inguinal hernia with obstruction. Patient at this time is hemodynamically stable and will be admitted for further management.   Hospital Course:  1-nausea/vomiting and hematemesis: appears to be secondary to UGI (erosive gastritis, esophagitis, AVM, malignancy, peptic ulcer disease or malory  weiss)  -Patient without any further nausea or vomiting and tolerating a regular diet. -Will discharge on Protonix twice a day, Carafate suspension and also Diflucan 100 mg x5 days for presumed Candida esophagitis. -GI has performed endoscopy, which demonstrated severe reflux esophagitis and changes of his distal esophagus with concern for Candida esophagitis and or barrette's esophagus   2-ventral and bilateral inguinal hernias: no obstruction appreciated on CT scan. Currently reducible.  -needs follow up with general surgery for timing regarding hernias repair.   3-leukocytosis: due to demargination; WBC's WNL again after hydraiton   4-new diagnosis of prostate cancer: s/p pet scan.  -needs outpatient follow up with urology (information provided to set up appointment for followup) -PET scan review no major signs of metastatic disease at this moment.  5-dysphagia: Secondary to severe reflux esophagitis. -No masses or strictures appreciated with endoscopy. -Biopsies were taken to rule out barrette's esophagus -Speech therapy evaluated patient and has recommended dysphagia 3/CHOP diet with thin liquids.  6-Hypokalemia: repleted   Procedures: EGD (07/29/14): -Severe reflux esophagitis at the distal esophagus. Concern for Candida esophagitis and possible barrette's esophagus (biopsies were taken and pending at discharge). -4 cm non reducible hiatal hernia.  Consultations:  GI (Dr. Olevia Perches)  Orthopedic service (Dr. Sharol Given)  Urology curbside (Dr. Junious Silk); recommended outpatient follow up for further evaluation and treatment of prostate cacer  Discharge Exam: Filed Vitals:   07/30/14 0432  BP: 130/71  Pulse: 82  Temp: 98.3 F (36.8 C)  Resp: 19   General: NAD, hungry. Denies nausea, vomiting or further hematemesis episode. No fever  Cardiovascular: S1 and S2, no rubs or gallops  Respiratory: CTA bilaterally  Abdomen: positive BS; ventral hernia and  bilateral inguinal hernia  (reducible). No guarding and no pain currently  Musculoskeletal: no edema or cyanosis  Discharge Instructions You were cared for by a hospitalist during your hospital stay. If you have any questions about your discharge medications or the care you received while you were in the hospital after you are discharged, you can call the unit and asked to speak with the hospitalist on call if the hospitalist that took care of you is not available. Once you are discharged, your primary care physician will handle any further medical issues. Please note that NO REFILLS for any discharge medications will be authorized once you are discharged, as it is imperative that you return to your primary care physician (or establish a relationship with a primary care physician if you do not have one) for your aftercare needs so that they can reassess your need for medications and monitor your lab values.  Discharge Instructions   Discharge instructions    Complete by:  As directed   Follow a dysphagia 3/Chopped diet with thin liquids Keep yourself well hydrated Take medications as prescribed Arrange follow up with urology service Arrange follow up with general surgery for timing regarding elective repair of hernias.            Medication List         fluconazole 100 MG tablet  Commonly known as:  DIFLUCAN  Take 1 tablet (100 mg total) by mouth daily.     HYDROcodone-acetaminophen 5-325 MG per tablet  Commonly known as:  NORCO/VICODIN  Take 0.5-1 tablets by mouth every 4 (four) hours as needed for moderate pain. Hernia pain     latanoprost 0.005 % ophthalmic solution  Commonly known as:  XALATAN  Place 1 drop into both eyes at bedtime.     pantoprazole 40 MG tablet  Commonly known as:  PROTONIX  Take 1 tablet (40 mg total) by mouth 2 (two) times daily.     sucralfate 1 GM/10ML suspension  Commonly known as:  CARAFATE  Take 10 mLs (1 g total) by mouth every 6 (six) hours.       No Known Allergies      Follow-up Information   Follow up with Asencion Noble, MD In 2 weeks.   Specialty:  Internal Medicine   Contact information:   [address] [place] [address] [phone]       Follow up with Festus Aloe, MD. (call office and set up follow up appointment (abnormal prostate appereance on Pet-scan; r/o prostate cancer))    Specialty:  Urology   Contact information:   [address] [place] [address] [phone]        The results of significant diagnostics from this hospitalization (including imaging, microbiology, ancillary and laboratory) are listed below for reference.    Significant Diagnostic Studies: Ct Head Wo Contrast  07/28/2014   CLINICAL DATA:  Nausea and vomiting.  Weakness.  EXAM: CT HEAD WITHOUT CONTRAST  TECHNIQUE: Contiguous axial images were obtained from the base of the skull through the vertex without intravenous contrast.  COMPARISON:  None.  FINDINGS: There is no evidence of acute infarction, mass lesion, or intra- or extra-axial hemorrhage on CT.  There is ex vacuo dilatation of the frontal horn of the left lateral ventricle, thought to reflect remote infarct.  The posterior fossa, including the cerebellum, brainstem and fourth ventricle, is within normal limits. The basal ganglia are unremarkable in appearance. No mass effect or midline shift is seen.  There is no evidence of fracture; visualized osseous structures are unremarkable in appearance. The visualized portions of the orbits are within normal limits. The paranasal sinuses and mastoid air cells are well-aerated. No significant soft tissue abnormalities are seen.  IMPRESSION: 1. No acute intracranial pathology seen on CT. 2. Ex vacuo dilatation of the frontal horn of the left lateral ventricle is thought to reflect remote infarct.   Electronically Signed   By: Garald Balding M.D.   On: 07/28/2014 22:40   Ct Abdomen Pelvis W Contrast  07/28/2014   CLINICAL DATA:  Recent diagnosis  of prostate cancer. Abdominal pain.  EXAM: CT ABDOMEN AND PELVIS WITH CONTRAST  TECHNIQUE: Multidetector CT imaging of the abdomen and pelvis was performed using the standard protocol following bolus administration of intravenous contrast.  CONTRAST:  50 mL OMNIPAQUE IOHEXOL 300 MG/ML SOLN, 100 mL OMNIPAQUE IOHEXOL 300 MG/ML SOLN  COMPARISON:  PET CT scan 07/26/2014. CT abdomen and pelvis 07/23/2014.  FINDINGS: Basilar atelectasis or scarring is identified. Oral contrast is present in the distal esophagus suggests reflux or esophageal dysmotility. Paraesophageal lymph nodes are again seen. Index node which had measured 1.5 cm short axis dimension is unchanged on image 13. There is no pleural or pericardial effusion.  The gallbladder, liver, adrenal glands and kidneys are unremarkable. A tiny hypo attenuating lesion the spleen is again seen and likely represents a cyst. Small fat containing ventral hernia is identified. The patient also has bilateral inguinal hernias. The hernia on the left contains a loop of colon without obstruction or other complicating feature, unchanged. The colon is otherwise unremarkable. Small bowel appears normal.  Retroperitoneal lymphadenopathy is again seen. Inter aortocaval node measuring 1.6 cm is unchanged on image 56. Lymph node along the inferior vena cava measuring 1.5 cm is unchanged on image 60 to. Right common iliac lymph node measuring 2.4 cm on image 68 is also unchanged. No focal bony abnormality is identified with lumbar spondylosis noted.  IMPRESSION: No acute finding abdomen or pelvis.  No change in paraesophageal and abdominal and pelvic lymphadenopathy.  Thickened walls of the distal esophagus is nonspecific but could be due to inflammatory change.  Midline fat containing ventral hernia and bilateral inguinal hernias. The hernia on the left contains loops of colon without obstruction or other complicating feature.   Electronically Signed   By: Inge Rise M.D.   On:  07/28/2014 20:55   Ct Abdomen Pelvis W Contrast  07/23/2014   CLINICAL DATA:  Abdominal pain, bloating, history kidney stones  EXAM: CT ABDOMEN AND PELVIS WITH CONTRAST  TECHNIQUE: Multidetector CT imaging of the abdomen and pelvis was performed using the standard protocol following bolus administration of intravenous contrast.  CONTRAST:  124mL OMNIPAQUE IOHEXOL 300 MG/ML SOLN Sagittal and coronal MPR images reconstructed from axial data set. IV. Dilute oral contrast.  COMPARISON:  11/18/2012  FINDINGS: Dependent atelectasis RIGHT lower lobe.  BILATERAL gynecomastia.  Liver, spleen, pancreas, kidneys, and adrenal glands normal appearance.  Retained contrast within esophagus question gastroesophageal reflux.  Adenopathy identified at inferior mediastinum, retrocrural, retroperitoneum and pelvis.  Paraesophageal node inferior mediastinum 15 mm short axis image 10.  Aortocaval node 16 mm short axis image 51.  RIGHT common iliac node 24 mm short axis image 63.  RIGHT external iliac node 17 mm short axis image 78.  BILATERAL inguinal hernias, smaller on RIGHT containing fat, larger on LEFT containing fat and nonobstructed segment of sigmoid colon.  Segment of mild wall thickening of sigmoid colon within and  immediately proximal to LEFT inguinal hernia associated with mild adjacent edema of the mesocolon question incarceration.  Mild hyper enhancement of the RIGHT lateral aspect of the rectum is noted, question proctitis No potentially large hemorrhoids could cause a similar appearance.  Stomach and remaining bowel loops normal appearance.  Normal appendix.  Umbilical hernia containing fat.  No additional mass, free fluid, or free air.  Prostatic enlargement.  Multilevel degenerative disc and facet disease changes primarily at lower lumbar spine.  IMPRESSION: Umbilical and RIGHT inguinal hernias containing fat.  LEFT inguinal hernia containing a segment of proximal sigmoid colon, with wall thickening of the involved  and more proximal sigmoid colon which likely represents edema and incarceration of the sigmoid colon within the hernia sac.  Enhancement of rectal wall question proctitis.  Retroperitoneal, pelvic, retrocrural in inferior mediastinal adenopathy of uncertain etiology.  Differential diagnosis would include lymphoma, metastatic disease, unlikely reactive process.  Findings discussed with Evalee Jefferson PA in ED on 07/23/2014 at 1205 hours.   Electronically Signed   By: Lavonia Dana M.D.   On: 07/23/2014 12:06   Nm Pet Image Initial (pi) Skull Base To Thigh  07/26/2014   CLINICAL DATA:  Initial treatment strategy for lymphadenopathy.  EXAM: NUCLEAR MEDICINE PET SKULL BASE TO THIGH  TECHNIQUE: 8.5 mCi F-18 FDG was injected intravenously. Full-ring PET imaging was performed from the skull base to thigh after the radiotracer. CT data was obtained and used for attenuation correction and anatomic localization.  FASTING BLOOD GLUCOSE:  Value: 143 mg/dl  COMPARISON:  CT abdomen pelvis dated 07/23/2014  FINDINGS: NECK  No hypermetabolic lymph nodes in the neck.  CHEST  Evaluation of lung parenchyma is constrained by respiratory motion.  Mild subpleural ground-glass nodular opacity in the lateral right upper lobe measuring 16 x 12 mm (series 6/image 25), [place] SUV 1.8, indeterminate.  Irregular wall thickening along the distal esophagus, just above a hiatal hernia (series 4/image 88), nonspecific and possibly reflecting underdistention when correlating with recent CT. No definite hypermetabolism on PET.  Adjacent paraesophageal nodes measuring up to 1.5 cm short axis (series 4/image 84), Sh[place] SUV 3.0.  Gynecomastia, non FDG avid.  ABDOMEN/PELVIS  No abnormal hypermetabolic activity within the liver, pancreas, left adrenal gland, or spleen.  Mild hypermetabolism of the bilateral adrenal glands without discrete mass, Tiffany SUV 3.1, likely reactive.  Layering gallbladder sludge and/or noncalcified gallstones (series 4/image 17). Vascular  calcifications. Large right inguinal/scrotal hernia containing sigmoid colon. Small right scrotal/inguinal hernia containing fat.  Asymmetric enlargement/bulge of the right lateral prostate apex (series 4/image 188), with associated hypermetabolism, [place] SUV 5.7.  Abdominal/retroperitoneal lymphadenopathy, including:  --1.0 cm short axis right retrocrural node (series 4/image 108), Ko[place] SUV 2.7  --1.6 cm short axis aortocaval node (series 4/image 136), [place] SUV 5.4  --2.4 cm short axis right common iliac node (series 4/image 12 CT), [place] SUV 4.0  --1.8 cm short axis left external iliac node (series 4/image 169), Isrrael SUV 3.5  SKELETON  Widespread/diffuse osseous hypermetabolism, without discrete/focal abnormality suspicious for osseous metastases. Representative SUV in the sacrum is 5.0.  Mild sclerosis involving the left ischial tuberosity, but without definite hypermetabolism (relative to remaining skeleton), Mandy 3.9.  IMPRESSION: Asymmetric enlargement of the right lateral prostatic apex with associated hypermetabolism. Correlate with PSA and consider urologic consultation to exclude prostate cancer.  Associated paraesophageal, retrocrural, retroperitoneal, and pelvic lymphadenopathy, as above.  Mild sclerosis involving the left ischial tuberosity, but without definite hypermetabolism. In the patient is diagnosed with prostate cancer,  consider nuclear medicine bone scan for further evaluation.  Mild distal esophageal wall thickening, without associated hypermetabolism, favored to reflect underdistention. Consider endoscopic correlation as clinically warranted.  1.6 cm is subpleural ground-glass nodular opacity in the lateral right upper lobe, mildly hypermetabolic, indeterminate. Dedicated follow-up CT chest is suggested in 3 months to assess for persistence.  Additional ancillary findings as above.   Electronically Signed   By: Julian Hy M.D.   On: 07/26/2014 15:06   Dg Chest Port 1 View  07/29/2014    CLINICAL DATA:  Follow-up infiltrates  EXAM: PORTABLE CHEST - 1 VIEW  COMPARISON:  PET-CT 07/26/2014  FINDINGS: Stable enlarged cardiac silhouette. There is fine interstitial pattern in the lungs. No focal consolidation. No pneumothorax.  IMPRESSION: Chronic interstitial lung disease.  No acute findings   Electronically Signed   By: Suzy Bouchard M.D.   On: 07/29/2014 07:15    Microbiology: Recent Results (from the past 240 hour(s))  MRSA PCR SCREENING     Status: None   Collection Time    07/28/14 11:25 PM      Result Value Ref Range Status   MRSA by PCR NEGATIVE  NEGATIVE Final   Comment:            The GeneXpert MRSA Assay (FDA     approved for NASAL specimens     only), is one component of a     comprehensive MRSA colonization     surveillance program. It is not     intended to diagnose MRSA     infection nor to guide or     monitor treatment for     MRSA infections.     Labs: Basic Metabolic Panel:  Recent Labs Lab 07/28/14 1816 07/29/14 0245  NA 139 142  K 3.1* 4.2  CL 102 105  CO2 23 27  GLUCOSE 134* 104*  BUN 26* 25*  CREATININE 0.97 1.07  CALCIUM 8.5 8.0*   Liver Function Tests:  Recent Labs Lab 07/28/14 1816 07/29/14 0245  AST 32 23  ALT 32 28  ALKPHOS 118* 99  BILITOT 1.1 0.9  PROT 6.2 5.5*  ALBUMIN 2.2* 1.9*   CBC:  Recent Labs Lab 07/28/14 1816 07/28/14 2257 07/29/14 0245 07/29/14 0647 07/29/14 1130  WBC 9.0 9.1 9.9 9.3 9.8  HGB 14.6 13.6 13.6 13.9 13.8  HCT 42.8 40.5 40.7 41.5 41.4  MCV 88.6 89.0 88.3 89.4 89.6  PLT 221 186 194 195 200   Cardiac Enzymes:  Recent Labs Lab 07/28/14 2257  TROPONINI <0.30   CBG:  Recent Labs Lab 07/29/14 2026 07/29/14 2339 07/30/14 0411 07/30/14 0742 07/30/14 1146  GLUCAP 83 87 85 108* 114*    Signed:  Barton Dubois  [facility] 07/30/2014, 12:57 PM

## 2014-08-10 ENCOUNTER — Inpatient Hospital Stay
Admission: RE | Admit: 2014-08-10 | Discharge: 2014-09-07 | Disposition: A | Discharge status: Home / Self Care | Payer: Medicare Other | Source: Ambulatory Visit | Attending: Internal Medicine | Admitting: Internal Medicine

## 2014-08-17 NOTE — H&P (Signed)
NAMESTEFANO, Bill Padilla                  ACCOUNT NO.:  [id number]  MEDICAL RECORD NO.:  [id number]  LOCATION:  S119                          FACILITY:  [facility]  PHYSICIAN:  Paula Compton. Willey Blade, MD       DATE OF BIRTH:  [birth year]  DATE OF ADMISSION:  08/10/2014 DATE OF DISCHARGE:  LH                             HISTORY & PHYSICAL   HISTORY OF PRESENT ILLNESS:  This patient is a 79 year old, white male, who was transferred from [facility] in [place] to the [facility] for rehabilitation following hospitalization.  He had undergone 2 inguinal hernia repairs and an umbilical hernia repair.  He had also been treated for an ileus.  He underwent a sigmoidoscopy.  He was found to have an area of colitis in his left colon.  Biopsies revealed an acutely inflamed and markedly reactive soft tissue.  This area had been noted on previous CT.  He has recently been diagnosed with widely metastatic carcinoma of the prostate.  PSA was 95.  He has had a Oncology consultation with Dr. Royanne Foots.  He also had a recent hospitalization at [facility] related to severe esophagitis.  He has been treated with PPI therapy.  He developed difficulty with urinary retention and required a Foley catheter briefly.  He was generally weak and clearly unable to manage activities of daily living.  He was therefore transferred to the [facility] for further rehabilitation.  PAST MEDICAL HISTORY: 1. Tonsillectomy 2. Right arm and right leg surgery at age 27.  MEDICATIONS:  Protonix 40 mg daily, Zofran p.r.n., DuoNebs p.r.n., Xalatan eye drops, Lovenox until mobility is improved.  ALLERGIES:  None.  SOCIAL HISTORY:  Does not smoke or drink or use recreational substances. His mother died at 22 of old age.  His father died at 57 of old age.  REVIEW OF SYSTEMS:  He continues to feel weak.  He is able to eat now. He is having some diarrhea.  He has had a negative stool checked for C diff.  He is now able to void without  difficulty.  He is not experiencing any edema now which had been treated with Lasix temporarily, had been treated for hypokalemia, also potassium normalized.  PHYSICAL EXAMINATION:  GENERAL:  Alert and oriented in no distress. HEENT:  No scleral icterus.  Nose and pharynx unremarkable. NECK:  Supple with no JVD or thyromegaly. LUNGS:  Clear. HEART:  Regular with no murmurs. ABDOMEN:  Soft and nontender with no palpable organomegaly.  His umbilical and 2 inguinal hernia wounds are healing well without signs of infection. EXTREMITIES:  No cyanosis, clubbing, or edema.  He has stable right arm weakness, NEUROLOGIC:  At baseline. LYMPH NODES:  No cervical or supraclavicular adenopathy.  IMPRESSION AND PLAN: 1. Status post bilateral inguinal hernia repairs and umbilical hernia     repair.  Continue physical therapy. 2. Widely metastatic prostate carcinoma.  Followup with Oncology. 3. Ileus, resolved. 4. Colitis, etiology unclear.  It appears it was treated with Flagyl     while at [facility].  This may require additional GI consultation. 5. Hypokalemia, resolved. 6. Urinary retention, resolved. 7. Postoperative edema, resolved.  Paula Compton. Willey Blade, MD     ROF/MEDQ  D:  08/17/2014  T:  08/17/2014  Job:  [id number]

## 2014-08-27 ENCOUNTER — Ambulatory Visit (INDEPENDENT_AMBULATORY_CARE_PROVIDER_SITE_OTHER): Payer: Medicare Other | Admitting: Surgery

## 2015-06-10 ENCOUNTER — Other Ambulatory Visit: Payer: Self-pay

## 2015-08-15 DEATH — deceased

## 2016-05-18 IMAGING — CT CT ABD-PELV W/ CM
1 of 3 series · 14 of 32 positions shown, 19 images · IV contrast (OMNIPAQUE 300)
Comparison: PET CT scan 07/26/2014. CT abdomen and pelvis
07/23/2014.

CLINICAL DATA: Recent diagnosis of prostate cancer. Abdominal pain.

EXAM:
CT ABDOMEN AND PELVIS WITH CONTRAST
TECHNIQUE: Multidetector CT imaging of the abdomen and pelvis was performed
using the standard protocol following bolus administration of
intravenous contrast.
CONTRAST:  50 mL OMNIPAQUE IOHEXOL 300 MG/ML SOLN, 100 mL OMNIPAQUE
IOHEXOL 300 MG/ML SOLN

[Series 2: abd/pel with · axial · 0.81mm/px · z∈[-390,+100]mm · 14 of 110 slices shown, 19 images]
[im 6/110  soft-tissue]
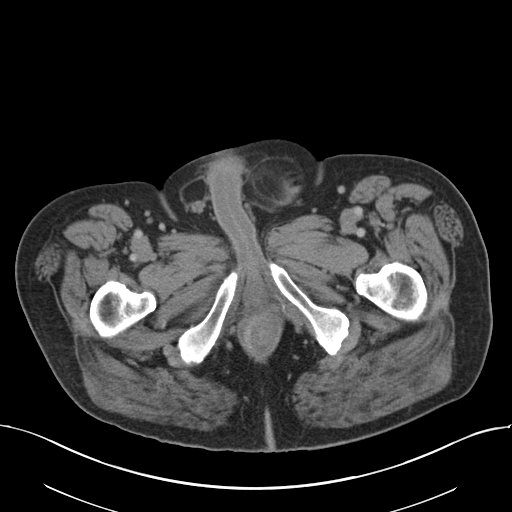
[im 6/110  bone]
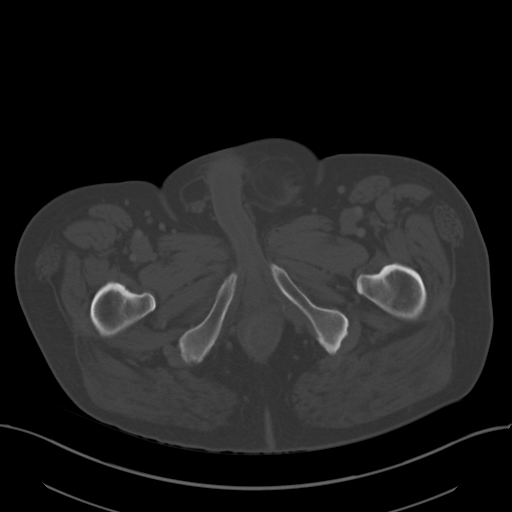
[im 17/110  soft-tissue]
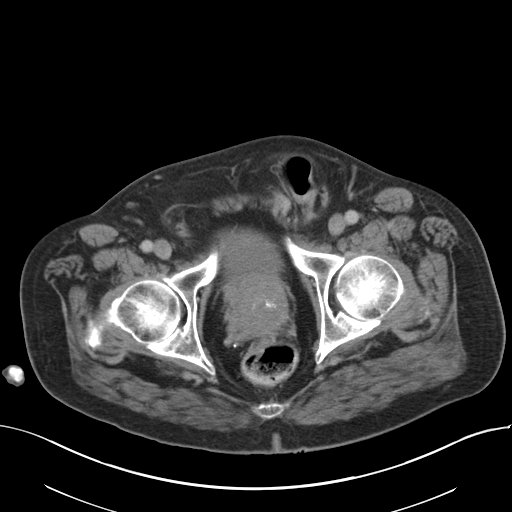
[im 22/110  soft-tissue]
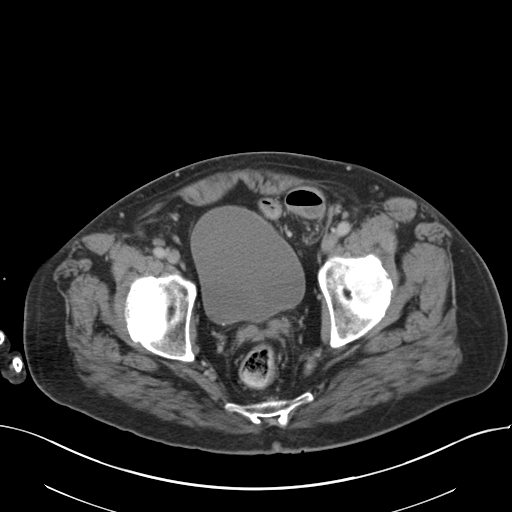
[im 33/110  soft-tissue]
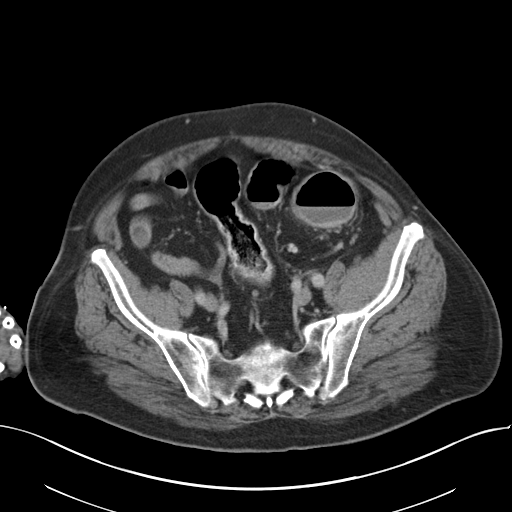
[im 39/110  soft-tissue]
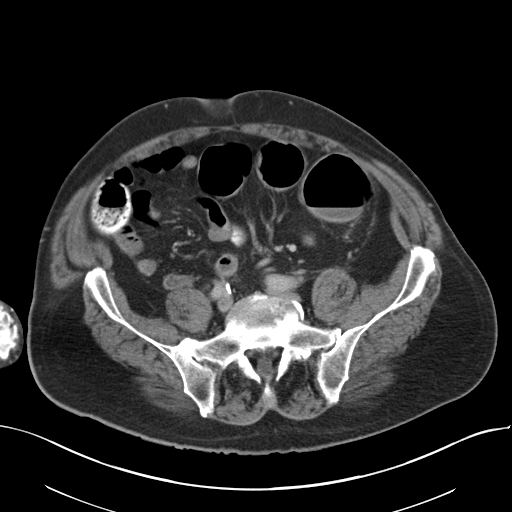
[im 50/110  soft-tissue]
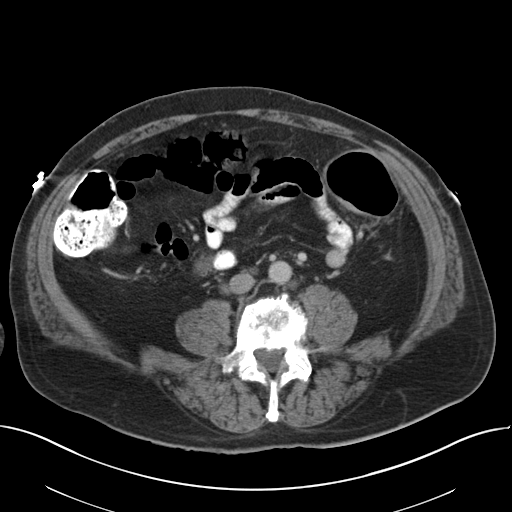
[im 55/110  soft-tissue]
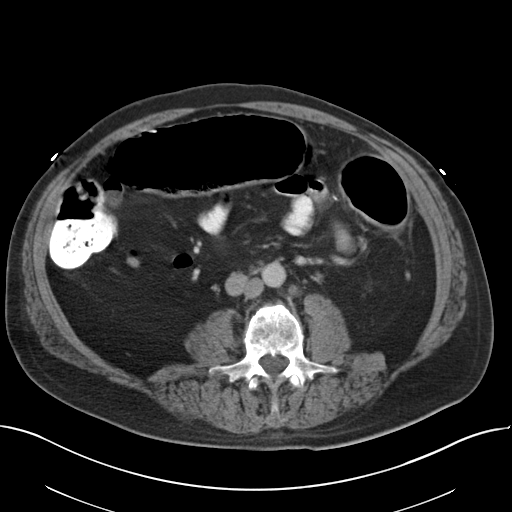
[im 60/110  soft-tissue]
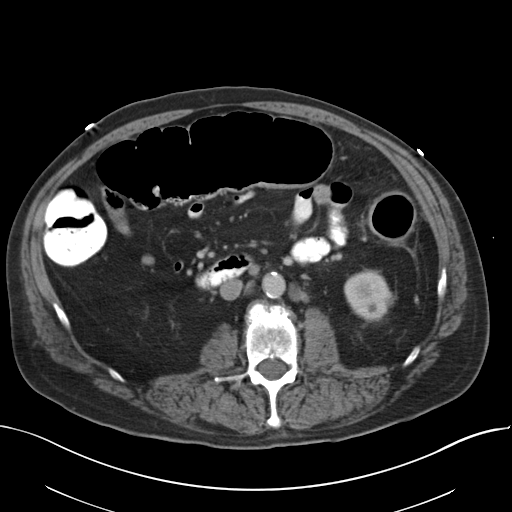
[im 71/110  soft-tissue]
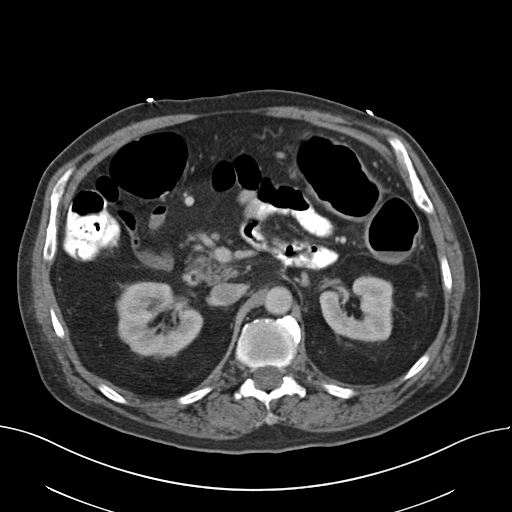
[im 71/110  bone]
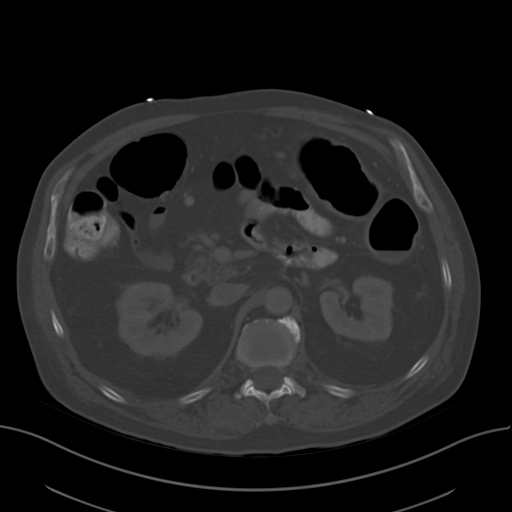
[im 77/110  soft-tissue]
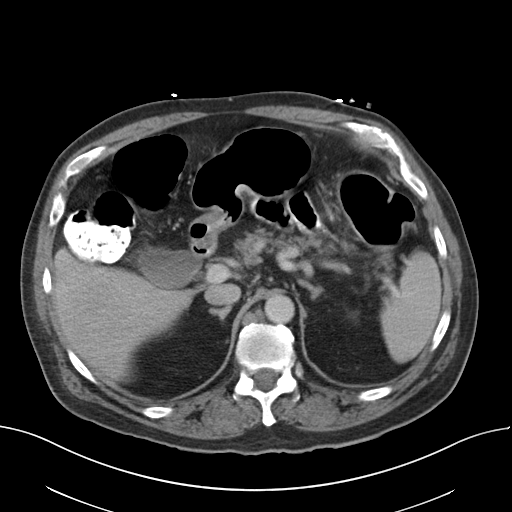
[im 88/110  soft-tissue]
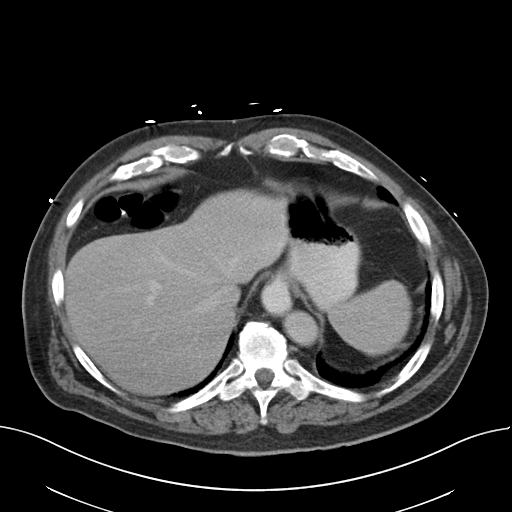
[im 88/110  lung]
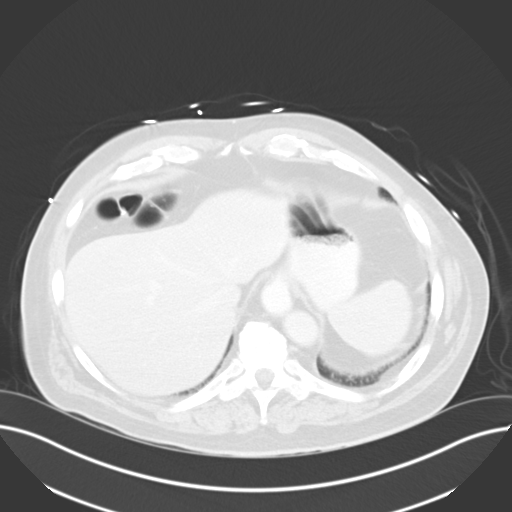
[im 93/110  soft-tissue]
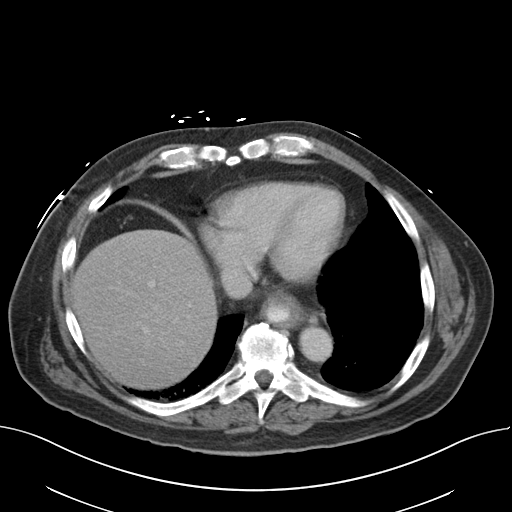
[im 93/110  lung]
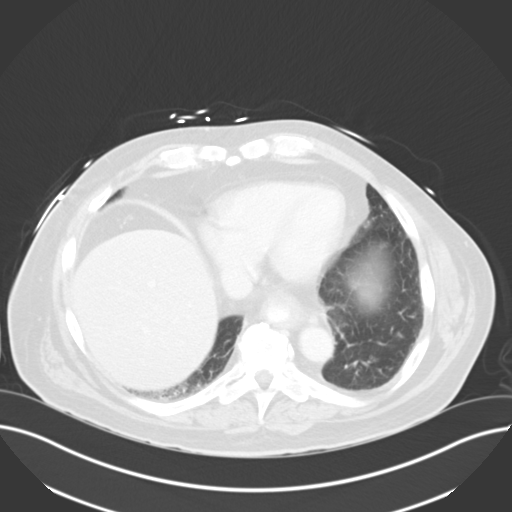
[im 99/110  lung]
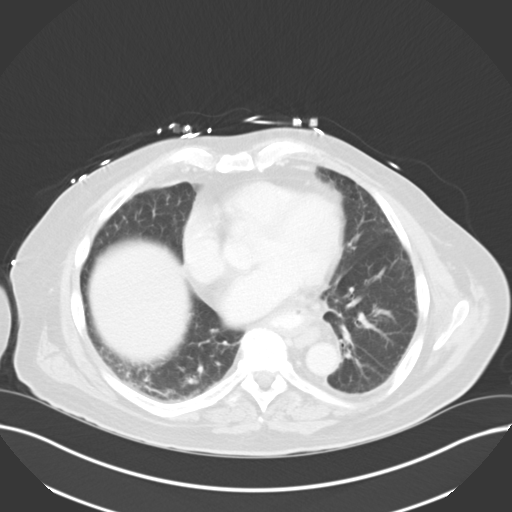
[im 104/110  soft-tissue]
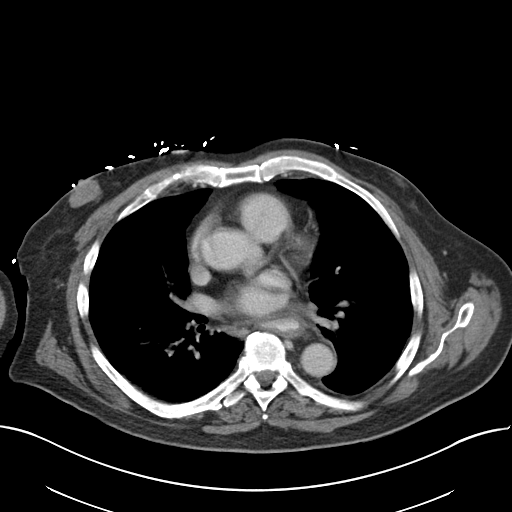
[im 104/110  lung]
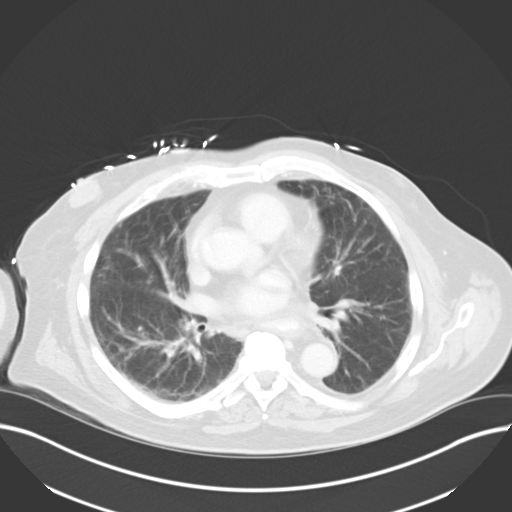

[14 of 32 positions shown; findings below may reference images not displayed]

FINDINGS: Basilar atelectasis or scarring is identified. Oral contrast is
present in the distal esophagus suggests reflux or esophageal
dysmotility. Paraesophageal lymph nodes are again seen. Index node
which had measured 1.5 cm short axis dimension is unchanged on image
13. There is no pleural or pericardial effusion.

The gallbladder, liver, adrenal glands and kidneys are unremarkable.
A tiny hypo attenuating lesion the spleen is again seen and likely
represents a cyst. Small fat containing ventral hernia is
identified. The patient also has bilateral inguinal hernias. The
hernia on the left contains a loop of colon without obstruction or
other complicating feature, unchanged. The colon is otherwise
unremarkable. Small bowel appears normal.

Retroperitoneal lymphadenopathy is again seen. Inter aortocaval node
measuring 1.6 cm is unchanged on image 56. Lymph node along the
inferior vena cava measuring 1.5 cm is unchanged on image 60 to.
Right common iliac lymph node measuring 2.4 cm on image 68 is also
unchanged. No focal bony abnormality is identified with lumbar
spondylosis noted.
IMPRESSION: No acute finding abdomen or pelvis.

No change in paraesophageal and abdominal and pelvic
lymphadenopathy.

Thickened walls of the distal esophagus is nonspecific but could be
due to inflammatory change.

Midline fat containing ventral hernia and bilateral inguinal
hernias. The hernia on the left contains loops of colon without
obstruction or other complicating feature.
